# Patient Record
Sex: Female | Born: 1955
Health system: Southern US, Community
[De-identification: ages and names within clinical notes are randomized; demographics above are authoritative.]

## PROBLEM LIST (undated history)

## (undated) DIAGNOSIS — G8929 Other chronic pain: Principal | ICD-10-CM

## (undated) DIAGNOSIS — M549 Dorsalgia, unspecified: Secondary | ICD-10-CM

## (undated) DIAGNOSIS — M2550 Pain in unspecified joint: Secondary | ICD-10-CM

## (undated) DIAGNOSIS — R12 Heartburn: Secondary | ICD-10-CM

## (undated) DIAGNOSIS — R0602 Shortness of breath: Secondary | ICD-10-CM

## (undated) DIAGNOSIS — M7989 Other specified soft tissue disorders: Secondary | ICD-10-CM

## (undated) DIAGNOSIS — I10 Essential (primary) hypertension: Secondary | ICD-10-CM

## (undated) DIAGNOSIS — G2581 Restless legs syndrome: Secondary | ICD-10-CM

## (undated) DIAGNOSIS — M545 Low back pain: Principal | ICD-10-CM

## (undated) DIAGNOSIS — E669 Obesity, unspecified: Secondary | ICD-10-CM

## (undated) DIAGNOSIS — E039 Hypothyroidism, unspecified: Secondary | ICD-10-CM

## (undated) DIAGNOSIS — K5900 Constipation, unspecified: Secondary | ICD-10-CM

## (undated) DIAGNOSIS — F419 Anxiety disorder, unspecified: Secondary | ICD-10-CM

## (undated) DIAGNOSIS — H409 Unspecified glaucoma: Secondary | ICD-10-CM

## (undated) HISTORY — DX: Other specified soft tissue disorders: M79.89

## (undated) HISTORY — PX: TUBAL LIGATION: SHX77

## (undated) HISTORY — DX: Other chronic pain: G89.29

## (undated) HISTORY — DX: Essential (primary) hypertension: I10

## (undated) HISTORY — DX: Low back pain: M54.5

## (undated) HISTORY — DX: Hypothyroidism, unspecified: E03.9

## (undated) HISTORY — DX: Obesity, unspecified: E66.9

## (undated) HISTORY — DX: Heartburn: R12

## (undated) HISTORY — DX: Restless legs syndrome: G25.81

## (undated) HISTORY — PX: FOOT SURGERY: SHX648

## (undated) HISTORY — DX: Dorsalgia, unspecified: M54.9

## (undated) HISTORY — DX: Constipation, unspecified: K59.00

## (undated) HISTORY — DX: Unspecified glaucoma: H40.9

## (undated) HISTORY — DX: Anxiety disorder, unspecified: F41.9

## (undated) HISTORY — DX: Shortness of breath: R06.02

## (undated) HISTORY — PX: TONSILLECTOMY: SUR1361

## (undated) HISTORY — DX: Pain in unspecified joint: M25.50

---

## 1999-06-22 ENCOUNTER — Other Ambulatory Visit: Admission: RE | Admit: 1999-06-22 | Discharge: 1999-06-22 | Payer: Self-pay | Admitting: Family Medicine

## 2000-07-17 ENCOUNTER — Encounter: Admission: RE | Admit: 2000-07-17 | Discharge: 2000-07-17 | Payer: Self-pay | Admitting: Family Medicine

## 2000-07-17 ENCOUNTER — Encounter: Payer: Self-pay | Admitting: Family Medicine

## 2000-07-23 ENCOUNTER — Encounter: Admission: RE | Admit: 2000-07-23 | Discharge: 2000-07-23 | Payer: Self-pay | Admitting: Family Medicine

## 2000-07-23 ENCOUNTER — Encounter: Payer: Self-pay | Admitting: Family Medicine

## 2000-09-13 ENCOUNTER — Ambulatory Visit (HOSPITAL_COMMUNITY): Admission: RE | Admit: 2000-09-13 | Discharge: 2000-09-13 | Payer: Self-pay | Admitting: Neurology

## 2002-06-04 ENCOUNTER — Encounter: Payer: Self-pay | Admitting: Neurology

## 2002-06-04 ENCOUNTER — Encounter: Admission: RE | Admit: 2002-06-04 | Discharge: 2002-06-04 | Payer: Self-pay | Admitting: Neurology

## 2002-07-07 ENCOUNTER — Encounter: Payer: Self-pay | Admitting: Neurology

## 2002-07-07 ENCOUNTER — Encounter: Admission: RE | Admit: 2002-07-07 | Discharge: 2002-07-07 | Payer: Self-pay | Admitting: Neurosurgery

## 2002-07-21 ENCOUNTER — Encounter: Admission: RE | Admit: 2002-07-21 | Discharge: 2002-07-21 | Payer: Self-pay | Admitting: Neurology

## 2002-07-21 ENCOUNTER — Encounter: Payer: Self-pay | Admitting: Radiology

## 2002-07-21 ENCOUNTER — Encounter: Payer: Self-pay | Admitting: Neurology

## 2002-08-08 ENCOUNTER — Encounter: Admission: RE | Admit: 2002-08-08 | Discharge: 2002-08-08 | Payer: Self-pay | Admitting: Neurology

## 2002-08-08 ENCOUNTER — Encounter: Payer: Self-pay | Admitting: Neurology

## 2003-01-20 ENCOUNTER — Encounter: Admission: RE | Admit: 2003-01-20 | Discharge: 2003-01-20 | Payer: Self-pay | Admitting: Neurology

## 2004-04-18 ENCOUNTER — Other Ambulatory Visit: Admission: RE | Admit: 2004-04-18 | Discharge: 2004-04-18 | Payer: Self-pay | Admitting: Obstetrics and Gynecology

## 2005-04-24 ENCOUNTER — Encounter: Admission: RE | Admit: 2005-04-24 | Discharge: 2005-04-24 | Payer: Self-pay | Admitting: Family Medicine

## 2005-05-08 ENCOUNTER — Other Ambulatory Visit: Admission: RE | Admit: 2005-05-08 | Discharge: 2005-05-08 | Payer: Self-pay | Admitting: Family Medicine

## 2005-08-29 ENCOUNTER — Encounter: Admission: RE | Admit: 2005-08-29 | Discharge: 2005-08-29 | Payer: Self-pay | Admitting: Family Medicine

## 2005-10-26 ENCOUNTER — Encounter: Admission: RE | Admit: 2005-10-26 | Discharge: 2005-10-26 | Payer: Self-pay | Admitting: Family Medicine

## 2005-11-07 ENCOUNTER — Other Ambulatory Visit: Admission: RE | Admit: 2005-11-07 | Discharge: 2005-11-07 | Payer: Self-pay | Admitting: Family Medicine

## 2006-05-23 ENCOUNTER — Other Ambulatory Visit: Admission: RE | Admit: 2006-05-23 | Discharge: 2006-05-23 | Payer: Self-pay | Admitting: Family Medicine

## 2008-02-10 ENCOUNTER — Other Ambulatory Visit: Admission: RE | Admit: 2008-02-10 | Discharge: 2008-02-10 | Payer: Self-pay | Admitting: Family Medicine

## 2008-06-02 ENCOUNTER — Encounter: Admission: RE | Admit: 2008-06-02 | Discharge: 2008-06-02 | Payer: Self-pay | Admitting: Family Medicine

## 2008-06-12 ENCOUNTER — Encounter: Admission: RE | Admit: 2008-06-12 | Discharge: 2008-06-12 | Payer: Self-pay | Admitting: Neurology

## 2008-06-16 ENCOUNTER — Encounter: Admission: RE | Admit: 2008-06-16 | Discharge: 2008-06-16 | Payer: Self-pay | Admitting: Family Medicine

## 2008-06-18 ENCOUNTER — Inpatient Hospital Stay (HOSPITAL_COMMUNITY): Admission: EM | Admit: 2008-06-18 | Discharge: 2008-06-21 | Payer: Self-pay | Admitting: Emergency Medicine

## 2009-02-25 ENCOUNTER — Other Ambulatory Visit: Admission: RE | Admit: 2009-02-25 | Discharge: 2009-02-25 | Payer: Self-pay | Admitting: Family Medicine

## 2009-11-02 ENCOUNTER — Emergency Department (HOSPITAL_BASED_OUTPATIENT_CLINIC_OR_DEPARTMENT_OTHER): Admission: EM | Admit: 2009-11-02 | Discharge: 2009-11-02 | Payer: Self-pay | Admitting: Emergency Medicine

## 2010-01-30 HISTORY — PX: VEIN SURGERY: SHX48

## 2010-03-01 ENCOUNTER — Other Ambulatory Visit: Payer: Self-pay | Admitting: Family Medicine

## 2010-03-03 ENCOUNTER — Ambulatory Visit
Admission: RE | Admit: 2010-03-03 | Discharge: 2010-03-03 | Disposition: A | Discharge status: Home / Self Care | Payer: Commercial Managed Care - PPO | Source: Ambulatory Visit | Attending: Family Medicine | Admitting: Family Medicine

## 2010-04-08 ENCOUNTER — Other Ambulatory Visit (HOSPITAL_COMMUNITY)
Admission: RE | Admit: 2010-04-08 | Discharge: 2010-04-08 | Disposition: A | Discharge status: Home / Self Care | Payer: 59 | Source: Ambulatory Visit | Attending: Family Medicine | Admitting: Family Medicine

## 2010-04-08 ENCOUNTER — Other Ambulatory Visit: Payer: Self-pay | Admitting: Family Medicine

## 2010-04-08 DIAGNOSIS — Z124 Encounter for screening for malignant neoplasm of cervix: Secondary | ICD-10-CM | POA: Insufficient documentation

## 2010-05-10 LAB — URINALYSIS, MICROSCOPIC ONLY
Glucose, UA: NEGATIVE mg/dL
Hgb urine dipstick: NEGATIVE
Ketones, ur: 15 mg/dL — AB
Protein, ur: NEGATIVE mg/dL
Specific Gravity, Urine: 1.014 (ref 1.005–1.030)
Urobilinogen, UA: 0.2 mg/dL (ref 0.0–1.0)

## 2010-05-10 LAB — COMPREHENSIVE METABOLIC PANEL
ALT: 27 U/L (ref 0–35)
AST: 19 U/L (ref 0–37)
Albumin: 3.2 g/dL — ABNORMAL LOW (ref 3.5–5.2)
CO2: 23 mEq/L (ref 19–32)
Calcium: 8.6 mg/dL (ref 8.4–10.5)
Calcium: 9.2 mg/dL (ref 8.4–10.5)
Chloride: 100 mEq/L (ref 96–112)
Chloride: 111 mEq/L (ref 96–112)
Creatinine, Ser: 0.75 mg/dL (ref 0.4–1.2)
Creatinine, Ser: 0.88 mg/dL (ref 0.4–1.2)
GFR calc Af Amer: >60 mL/min (ref >60)
GFR calc Af Amer: >60 mL/min (ref >60)
GFR calc non Af Amer: >60 mL/min (ref >60)
Sodium: 141 mEq/L (ref 135–145)
Sodium: 142 mEq/L (ref 135–145)

## 2010-05-10 LAB — CBC
MCHC: 34.3 g/dL (ref 30.0–36.0)
MCHC: 34.5 g/dL (ref 30.0–36.0)
MCHC: 34.8 g/dL (ref 30.0–36.0)
MCHC: 35.3 g/dL (ref 30.0–36.0)
MCV: 89.9 fL (ref 78.0–100.0)
MCV: 90.2 fL (ref 78.0–100.0)
MCV: 90.7 fL (ref 78.0–100.0)
Platelets: 172 10*3/uL (ref 150–400)
Platelets: 181 10*3/uL (ref 150–400)
RBC: 3.5 MIL/uL — ABNORMAL LOW (ref 3.87–5.11)
RBC: 3.68 MIL/uL — ABNORMAL LOW (ref 3.87–5.11)
RBC: 3.68 MIL/uL — ABNORMAL LOW (ref 3.87–5.11)
RDW: 13.5 % (ref 11.5–15.5)
RDW: 14.1 % (ref 11.5–15.5)
WBC: 13.7 10*3/uL — ABNORMAL HIGH (ref 4.0–10.5)

## 2010-05-10 LAB — PROTIME-INR
INR: 1.1 (ref 0.00–1.49)
Prothrombin Time: 14.8 seconds (ref 11.6–15.2)

## 2010-05-10 LAB — BASIC METABOLIC PANEL
BUN: 3 mg/dL — ABNORMAL LOW (ref 6–23)
CO2: 22 mEq/L (ref 19–32)
CO2: 22 mEq/L (ref 19–32)
Calcium: 8.4 mg/dL (ref 8.4–10.5)
Calcium: 8.6 mg/dL (ref 8.4–10.5)
Chloride: 110 mEq/L (ref 96–112)
Creatinine, Ser: 0.72 mg/dL (ref 0.4–1.2)
Creatinine, Ser: 0.72 mg/dL (ref 0.4–1.2)
GFR calc Af Amer: >60 mL/min (ref >60)
GFR calc non Af Amer: >60 mL/min (ref >60)
Glucose, Bld: 97 mg/dL (ref 70–99)
Sodium: 143 mEq/L (ref 135–145)

## 2010-05-10 LAB — DIFFERENTIAL
Eosinophils Absolute: 0.1 10*3/uL (ref 0.0–0.7)
Eosinophils Relative: 0 % (ref 0–5)
Eosinophils Relative: 1 % (ref 0–5)
Lymphocytes Relative: 15 % (ref 12–46)
Lymphs Abs: 2 10*3/uL (ref 0.7–4.0)
Lymphs Abs: 2.5 10*3/uL (ref 0.7–4.0)
Monocytes Absolute: 0.7 10*3/uL (ref 0.1–1.0)
Monocytes Absolute: 0.9 10*3/uL (ref 0.1–1.0)
Monocytes Relative: 5 % (ref 3–12)
Monocytes Relative: 6 % (ref 3–12)
Neutro Abs: 12.6 10*3/uL — ABNORMAL HIGH (ref 1.7–7.7)
Neutrophils Relative %: 78 % — ABNORMAL HIGH (ref 43–77)

## 2010-05-10 LAB — URINE CULTURE: Colony Count: 40000

## 2010-05-10 LAB — APTT: aPTT: 25 seconds (ref 24–37)

## 2010-06-14 NOTE — H&P (Signed)
Alyssa Blair, Alyssa Blair NO.:  [id number]   MEDICAL RECORD NO.:  [id number]          PATIENT TYPE:  EMS   LOCATION:  MAJO                         FACILITY:  [facility]   PHYSICIAN:  Ramiro Harvest, MD    DATE OF BIRTH:  01-Sep-1955   DATE OF ADMISSION:  06/18/2008  DATE OF DISCHARGE:                              HISTORY & PHYSICAL   PRIMARY CARE PHYSICIAN:  Donia Guiles, M.D. of [facility].   HISTORY OF PRESENT ILLNESS:  Alyssa Blair is a 55 year old white female  with history of ischemic colitis, hypertension, hypothyroidism, who  presented to the ED with a 9 day history of constipation.  The patient  states that 7 days prior to admission, developed diffuse constant  abdominal pain described as labor pains and stabbing in nature with  pressure.  The patient has stated that she tried several over-the-  counter regimens including Milk of Magnesia, stool softeners, Senokot,  Dulcolax and magnesium citrate with no improvement.  The patient stated  that she tried a colonoscopy prep the night prior to admission with no  relief.  The patient saw her PCP 2 days prior to admission and abdominal  films done then showed a large amount of feces with no obstruction or  free air seen.  CT of the abdomen and pelvis which were done 2 days  prior to admission showed mild diffuse fatty infiltration of the liver  with no acute abnormality and no significant pelvic abnormality.  The  patient presented to the ED, was seen in the ED, given some Fleet's  enema and Dulcolax with minimal amount of stool.  Acute abdominal series  which was done in the emergency department showed no acute chest  disease, but mild small bowel distention and some air-fluid levels  suspicious for ileus or early small-bowel obstruction, fluid and  contrast material from recent CT scan were noted throughout the colon.  We were called to admit the patient.   ALLERGIES:  CODEINE and SULFA causes hives and  emesis.   PAST MEDICAL HISTORY:  1. History of paresthesias of the hands and the feet.  2. Hypertension.  3. Ischemic colitis.  4. Hypothyroidism.   HOME MEDICATIONS:  1. Benicar/HCTZ 20/12.5 mg p.o. daily.  2. Synthroid 75 mcg p.o. daily.  3. Nortriptyline 100 mg q.h.s.  4. Multivitamin daily.   SOCIAL HISTORY:  No tobacco use.  No alcohol use.  No IV drug use.  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   FAMILY HISTORY:  Noncontributory.   REVIEW OF SYSTEMS:  As per HPI, otherwise negative.   PHYSICAL EXAMINATION:  VITAL SIGNS:  Temperature 98.7, blood pressure  140/80, pulse of 95, respirations 18, satting 95% on room air.  GENERAL:  Patient lying on gurney in minimal distress.  HEENT:  Normocephalic, atraumatic.  Pupils are equal, round and reactive  to light and accommodation.  Extraocular movements intact.  Oropharynx  was clear.  No lesions.  No exudates.  NECK:  Supple.  No lymphadenopathy.  Mildly dry mucous membranes.  RESPIRATORY:  Lungs are  clear to auscultation bilaterally.  No wheezes.  No crackles.  No rhonchi.  CARDIOVASCULAR:  Regular rate and rhythm.  No murmurs, rubs or gallops.  ABDOMEN:  Soft, diffuse abdominal tenderness to palpation lower abdomen,  greater than upper abdomen.  Positive bowel sounds.  Mild distention.  No rebound and no guarding.  EXTREMITIES:  No clubbing, cyanosis or edema.  NEUROLOGIC:  The patient is alert and oriented x3.  Cranial nerves II-  XII are grossly intact.  No focal deficits.   LABORATORY DATA:  CBC:  White count 16.1, hemoglobin 12.6, platelets of  209, hematocrit 36.2, ANC of 12.6.   DIAGNOSTICS:  Acute abdominal series showed no acute disease in the  chest.  Mild small bowel distention and some air-fluid levels suspicious  for ileus or early small-bowel obstruction, fluid and contrast material  from recent CTs throughout the colon.   ASSESSMENT/PLAN:  Alyssa Blair is a 55 year old female  history of  hypothyroidism, ischemic colitis, hypertension, paresthesias of the  hands and feet, who presented to the emergency department with 9-day  history of constipation and found to have a probable ileus versus early  small-bowel obstruction on plain films of the abdomen.  1. Ileus versus early small-bowel obstruction likely secondary to      constipation/obstipation.  We will admit the patient.  Check a      magnesium level.  Check a TSH.  Check a lipase.  Check coags.      Check a comprehensive metabolic profile.  Check UA with cultures      and sensitivities.  Check serial abdominal films.  We will make the      patient n.p.o., IV fluids, pain management, early mobility.  We      will try some tap water enemas.  We will keep potassium greater      than 4.  Keep magnesium greater than 2.  If no improvement with tap      water enemas, may consider disimpaction and a GI consult for      further evaluation and recommendation.  If worsening symptoms,      consider CT of the abdomen and pelvis.  2. Leukocytosis, likely reactive leukocytosis versus infectious      etiology.  Chest x-ray was negative.  Check a UA with cultures and      sensitivities.  No need for antibiotics at this time.  3. Hypothyroidism.  Check a TSH.  Continue home dose Synthroid.  4. Dehydration.  IV fluids.  5. Hypertension, hold blood pressure medications.  6. Ischemic colitis, monitor her.  7. History of paresthesias.  Continue home dose nortriptyline.  8. Prophylaxis:  Protonix for GI prophylaxis.  SCDs for DVT      prophylaxis.   It has been a pleasure taking care of Ms. Alyssa Blair.      Ramiro Harvest, MD  Electronically Signed     DT/MEDQ  D:  06/18/2008  T:  06/18/2008  Job:  [id number]   cc:   Donia Guiles, M.D.

## 2010-06-14 NOTE — Discharge Summary (Signed)
[name], [name]:  [id number]   MEDICAL RECORD NO.:  [id number]          PATIENT TYPE:  INP   LOCATION:  5511                         FACILITY:  [facility]   PHYSICIAN:  Ramiro Harvest, MD    DATE OF BIRTH:  01-02-1956   DATE OF ADMISSION:  06/18/2008  DATE OF DISCHARGE:  06/21/2008                               DISCHARGE SUMMARY   The patient's primary care physician is Dr. Donia Guiles of [facility].   DISCHARGE DIAGNOSES:  1. Ileus resolved.  2. Severe chronic constipation.  3. Hypothyroidism.  4. Dehydration.  5. Reactive leukocytosis resolved.  6. Hypertension.  7. Paresthesias.  8. History of ischemic colitis.  9. Hypothyroidism.   DISCHARGE MEDICATIONS:  1. Amitiza 24 mg p.o. b.i.d.  2. MiraLax 17 grams p.o. daily.  3. Synthroid 100 mcg p.o. daily.  4. Nortriptyline 50 mg p.o. q.h.s.  5. Benicar HCTZ 20/12.5 mg p.o. daily.  6. Multivitamin 1 tablet p.o. daily.   DISPOSITION AND FOLLOW-UP:  The patient will be discharged home.  The  patient is to follow up with Dr. Charlott Rakes of [facility] in the next 2-3 weeks.  The patient is also to follow  up with PCP in the next 1-2 weeks. On follow-up with PCP the patient's  Synthroid was increased to 100 mcg daily secondary to an elevated TSH of  6.235.  The patient will need a repeat TSH done in about 4-6 weeks.  The  patient will also need a basic metabolic profile check to follow up on  electrolytes and renal function.   CONSULTATION:  A GI consult was done.  The patient was seen in  consultation by Dr. Charlott Rakes of [facility] on Jun 19, 2008.   PROCEDURES PERFORMED:  1. An acute abdominal series was performed on Jun 18, 2008 that showed      no acute disease within the chest, mild small bowel distention, and      some air-fluid levels suspicious for ileus or early small-bowel      obstruction. Fluid and contrast material from recent CT scan  noted      throughout the colon.  2. Acute abdominal series Jun 19, 2008 showed probable ileus and      minimal basilar atelectasis.  3. An acute abdominal series was done on Jun 20, 2008 which showed      overall improvement in distention of bowel loops. Currently there      is a nonobstructive bowel gas pattern.   BRIEF ADMISSION HISTORY AND PHYSICAL:  Alyssa Blair is a 55 year old  white female with a history of ischemic colitis, hypertension, and  hypothyroidism, who were presented to the ED with a 9-day history of  constipation.  The patient stated that 7 days prior to admission she  developed diffuse constant abdominal pain described as labor pains and  stabbing in nature and pressure-like. The patient stated that she had  tried several over-the-counter regimens, including milk of magnesia,  stool softener, Senokot, Dulcolax, magnesium citrate, with no  improvement.  The patient also  stated that she tried a colonoscopy prep  the night prior to admission with no relief.  The patient saw her PCP 2  days prior to admission.  Abdominal films were done that showed a large  amount of feces within no obstruction or free air.  CT of the abdomen  and pelvis which was done 2 days prior to admission showed mild diffuse  fatty infiltration of the liver with no acute abnormality and no  significant pelvic abnormality.  The patient presented to the ED, seen  in the ED given a Fleets enema and Dulcolax with minimal amount of  stool. Acute abdominal series which was done in the ED showed no acute  chest disease but mild small bowel distention and some air-fluid levels  suspicious for ileus or early small-bowel obstruction. Fluid and  contrast material from recent CT scan were noted throughout the colon.  We were called to admit the patient.   PHYSICAL EXAM:  VITAL SIGNS:  Temperature 98.7, blood pressure 140/80,  pulse of 95, respirations 18, satting 95% on room air.  GENERAL: The patient  lying on gurney, minimal distress.  HEENT: Normocephalic, atraumatic.  Pupils equal, round and reactive to  light and accommodation.  Extraocular movements intact.  Oropharynx is  clear.  No lesions.  No exudates.  NECK:  Supple.  No lymphadenopathy.  Mildly dry mucous membranes.  RESPIRATORY:  Lungs are clear to auscultation bilaterally.  No wheezes,  no crackles or rhonchi.  CARDIOVASCULAR: Regular rate and rhythm.  No  murmurs, rubs or gallops.  ABDOMEN:  Soft, diffuse abdominal tenderness to palpation in the lower  abdomen greater than the upper abdomen. Positive bowel sounds.  Mild  distention, no rebound or guarding.  EXTREMITIES: No clubbing, cyanosis or edema.  NEUROLOGICAL:  The patient is alert and oriented x3.  Cranial nerves II-  XII are grossly intact.  No focal deficits.   ADMISSION LABS:  CBC white count 16.1, hemoglobin 12.6, platelets of  209, hematocrit 36.2, ANC of 12.6.  Acute abdominal series as stated  above.   HOSPITAL COURSE:  Ileus/severe chronic constipation.  The patient was  admitted with ileus, severe chronic constipation.  The patient was  monitored.  She was made n.p.o., placed on a bowel rest with supportive  care with IV fluids and pain management.  A TSH level was checked which  came back at 6.235.  The patient's Synthroid was then increased to 100  mcg daily.  The patient was monitored. Potassium was tried to be kept  greater than 4 and the magnesium kept greater than 2.  Serial abdominal  films were obtained with the results as stated above.  The patient  improved.  Post hospital day #1 was placed on a clear liquid diet and GI  was consulted.  The patient was seen in consultation on Jun 19, 2008 by  Dr. Charlott Rakes. The patient was continued on further Fleets enemas  and started on MiraLax twice daily.  The patient did have multiple bowel  movements with clinical improvement.  The patient's diet was advanced.  The patient improved clinically  such that by day of discharge the  patient was in stable and improved condition.  It was recommended by  gastroenterology that the patient be maintained on Amitiza 24 mg b.i.d.  as well as MiraLax 17 grams orally daily with follow-up with Dr. Charlott Rakes of gastroenterology in 2-3 weeks. The rest of the patient's  chronic medical issues remained stable during  the hospitalization. In  terms of the patient's hypothyroidism a TSH was checked which came back  elevated at 6.235 and as such the patient's Synthroid was increased from  75 mcg to 100 mcg daily.  The patient will need follow-up with her PCP  in terms of hypothyroidism. Will need a repeat TSH done in about 4-6  weeks and further titration of her Synthroid will be deferred to her  PCP. The rest of the patient's chronic medical issues remained stable  throughout the hospitalization and the patient will be discharged in  stable and improved condition.   On day of discharge vital signs temperature 98.1, blood pressure 123/65,  pulse of 75, respirations 20, satting 92% on room air.   DISCHARGE LABS:  Sodium 143, potassium 3.7, chloride 115, bicarb 22, BUN  3, creatinine 0.72, glucose of 86, calcium of 8.4.  CBC with a white  count of 9.3, hemoglobin of 10.9, platelets of 185, hematocrit of 31.7.  it has been a pleasure taking care of Alyssa Blair.      Ramiro Harvest, MD  Electronically Signed     DT/MEDQ  D:  06/21/2008  T:  06/21/2008  Job:  [id number]   cc:   Donia Guiles, M.D.  Shirley Friar, MD

## 2010-06-14 NOTE — Consult Note (Signed)
NAMEASHE, GRAYBEAL                 ACCOUNT NO.:  [id number]   MEDICAL RECORD NO.:  [id number]          PATIENT TYPE:  INP   LOCATION:  5511                         FACILITY:  [facility]   PHYSICIAN:  Shirley Friar, MDDATE OF BIRTH:  Feb 03, 1955   DATE OF CONSULTATION:  06/19/2008  DATE OF DISCHARGE:                                 CONSULTATION   REFERRING PHYSICIAN:  Lonia Blood, MD   We were called to see Ms. Banta today by Dr. Lonia Blood, MD, of [facility].   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  She reports that she had no bowel movements for the past 9-10  days.  She developed severe stabbing abdominal pain.  She has not had  any vomiting.  At home, the patient regularly consumes 4 Colace at  bedtime, however, when she was not producing stools, she also started to  consume magnesium citrate and Dulcolax.  When these were ineffective,  she went to see her primary care physician, Dr. Donia Guiles, who  called [facility], and the patient picked up a MoviPrep and  she consume that without results.  This was all prior to admission.  The  patient also tells me that she had a spinal injection last week to help  with her paresthesias that she has in her extremities.  She does not  take any opiates.  Her last colonoscopy was in 2006 at [facility], it was done by Dr. Marcelene Butte; however, she does not  regularly follow with him and wants to come to the [facility] as she is associated with Dr. Donia Guiles.   She does have a past medical history concerning for ischemic colitis and  multiple episodes of diverticulitis.  She has a history of hypertension,  hypothyroidism, and as mentioned earlier, paresthesias in her hands and  feet.   CURRENT MEDICATIONS:  Benicar, nortriptyline, and Levoxyl.  She is also  on  regular milk of magnesia and Colace.   ALLERGIES:  She has allergies to CODEINE and SULFA.   SOCIAL HISTORY:  Negative for alcohol, tobacco, and drugs.  She works at  [employer].   FAMILY HISTORY:  Positive for constipation.  Negative for colon cancer  or bowel surgery.   PHYSICAL EXAMINATION:  GENERAL:  She is alert, oriented, in no apparent  distress.  She does become tearful when talking about her condition  because she is frustrated.  VITAL SIGNS:  Temperature 98.1, pulse 81, respirations 18, and blood  pressure is 119/59.  HEART:  Regular rate and rhythm.  LUNGS:  Clear.  ABDOMEN:  Obese, slightly tender in the right lower quadrant.  She has  good bowel sounds.   LABORATORIES:  Hemoglobin of 11.5, hematocrit 33.4, white count is 13.7  that is down from 16.1 yesterday, and platelets are 181,000.  She has a  TSH of 6.235.  Her coags are normal.  Her LFTs are within normal limits.  Her BMET is within normal limits.   RADIOLOGICAL EXAMS:  She had a x-ray done on May 18 that showed stool  throughout her colon.  She had a CT scan done the same day that showed  diffuse mild fatty liver, degenerative changes of the spine, but no  acute abnormality.  She had an x-ray done May 20 that showed early SBO.  She had x-ray done today May 21 that showed probable ileus with air in  the small bowel and dilation in the colon.   ASSESSMENT:  Dr. Charlott Rakes has seen and examined the patient,  collected history, and reviewed her chart.  His impression is that she  has severe chronic constipation.  She did get some relief after one  small bowel movement last night.  She now has ileus on x-ray.  She is  hypothyroid.  She appears to have a chronically slow moving GI tract.   PLAN:  Synthroid is being given.  Potassium supplementation has been  given and is appropriate.  Would continue tap water enemas and hold off  on oral laxatives today, but continues enemas and disimpaction attempts   as well as get the patient ambulating in the hallways.  Would consider  colonoscopy when the patient is cleaned out.  We will gain records from  [facility] of previous colonoscopy.   Thank you very much for this consultation.       Stephani Police, [place]      Shirley Friar, MD  Electronically Signed    MLY/MEDQ  D:  06/19/2008  T:  06/20/2008  Job:  [id number]   cc:   Shirley Friar, MD  Donia Guiles, M.D.

## 2010-06-17 NOTE — Op Note (Signed)
[place]  Patient:    Alyssa Blair, Alyssa Blair                          MRN: [id number] Proc. Date: 09/13/00 Adm. Date:  [id number] Attending:  Lesly Dukes CC:         [facility],  [address]   Operative Report  PROCEDURE:  Lumbar puncture note.  HISTORY OF PRESENT ILLNESS:  [name] is a 55 year old white female, born 10/24/1955, with a history of paresthesias on all fours.  An MRI scan of the brain showed minimal white matter abnormalities, and the patient is being evaluated for a demyelinating disease at this point.  A lumbar puncture was performed with the patient in the fetal position on the left side.  Risks of the procedure were explained to the patient including bleeding, infection, nerve damage, and headache.  The patient understood the procedure and consented to the procedure.  DESCRIPTION OF PROCEDURE:  The lower back was cleaned with Betadine solution, and approximately 2 cc of 1% Xylocaine was used as local anesthetic.  A 20-gauge spinal needle was inserted into the L3-L4 interspace, and approximately 16 cc of clear, colorless spinal fluid was removed for testing. Opening pressure was 145 mm of water.  Tube #1 was sent for VDRL, cryptococcal antigen.  Tube #2 was sent for oligoclonal banding, IgG-albumin ratio.  Tube #3 was sent for cells, differential, glucose, and protein.  Tube #4 was sent for Lymes panel.  There were no complications to the above procedure.  The patient tolerated the procedure well.  Blood work was also sent for ANA, rheumatoid factor, sedimentation rate, antiphospholipid antibody panel, homocystine level, antithrombin-III, and protein-S, protein-C levels.  The patient will follow up with the office in about seven days. DD:  09/13/00 TD:  09/13/00 Job: [id number]

## 2010-10-10 IMAGING — CR DG FOOT COMPLETE 3+V*L*
3 series · 3 of 3 positions shown · non-contrast
Comparison: None

CLINICAL DATA: Left foot pain and swelling.

LEFT FOOT - COMPLETE 3+ VIEW

[t foot ap left]
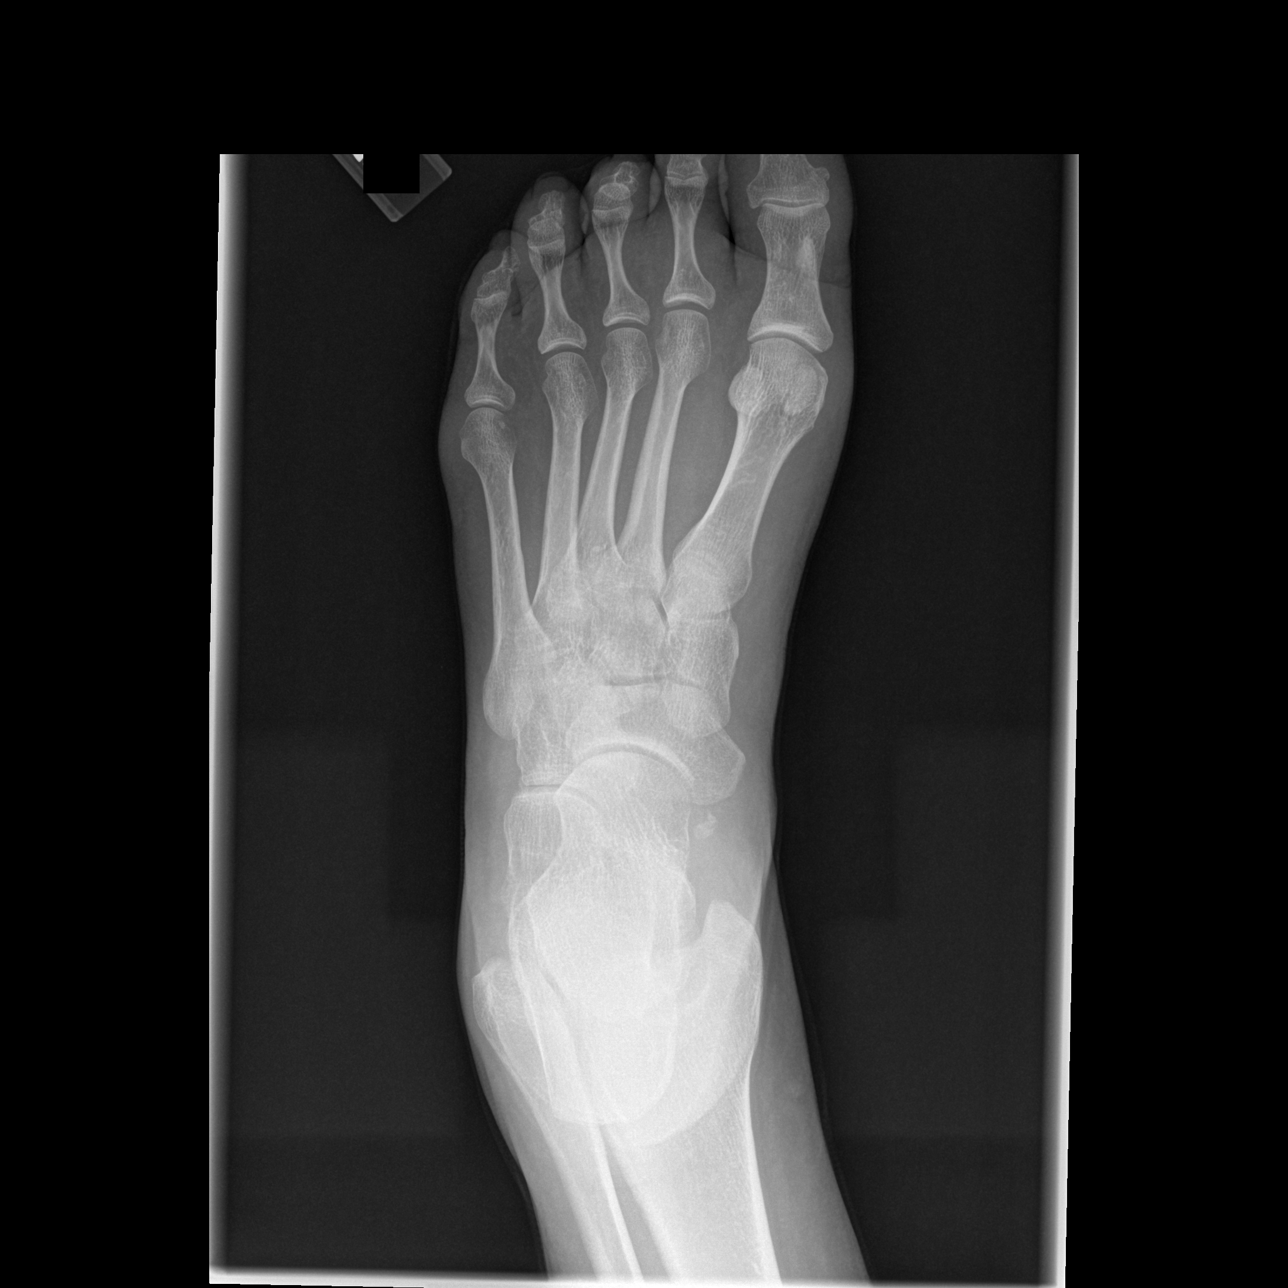

[t foot oblique left]
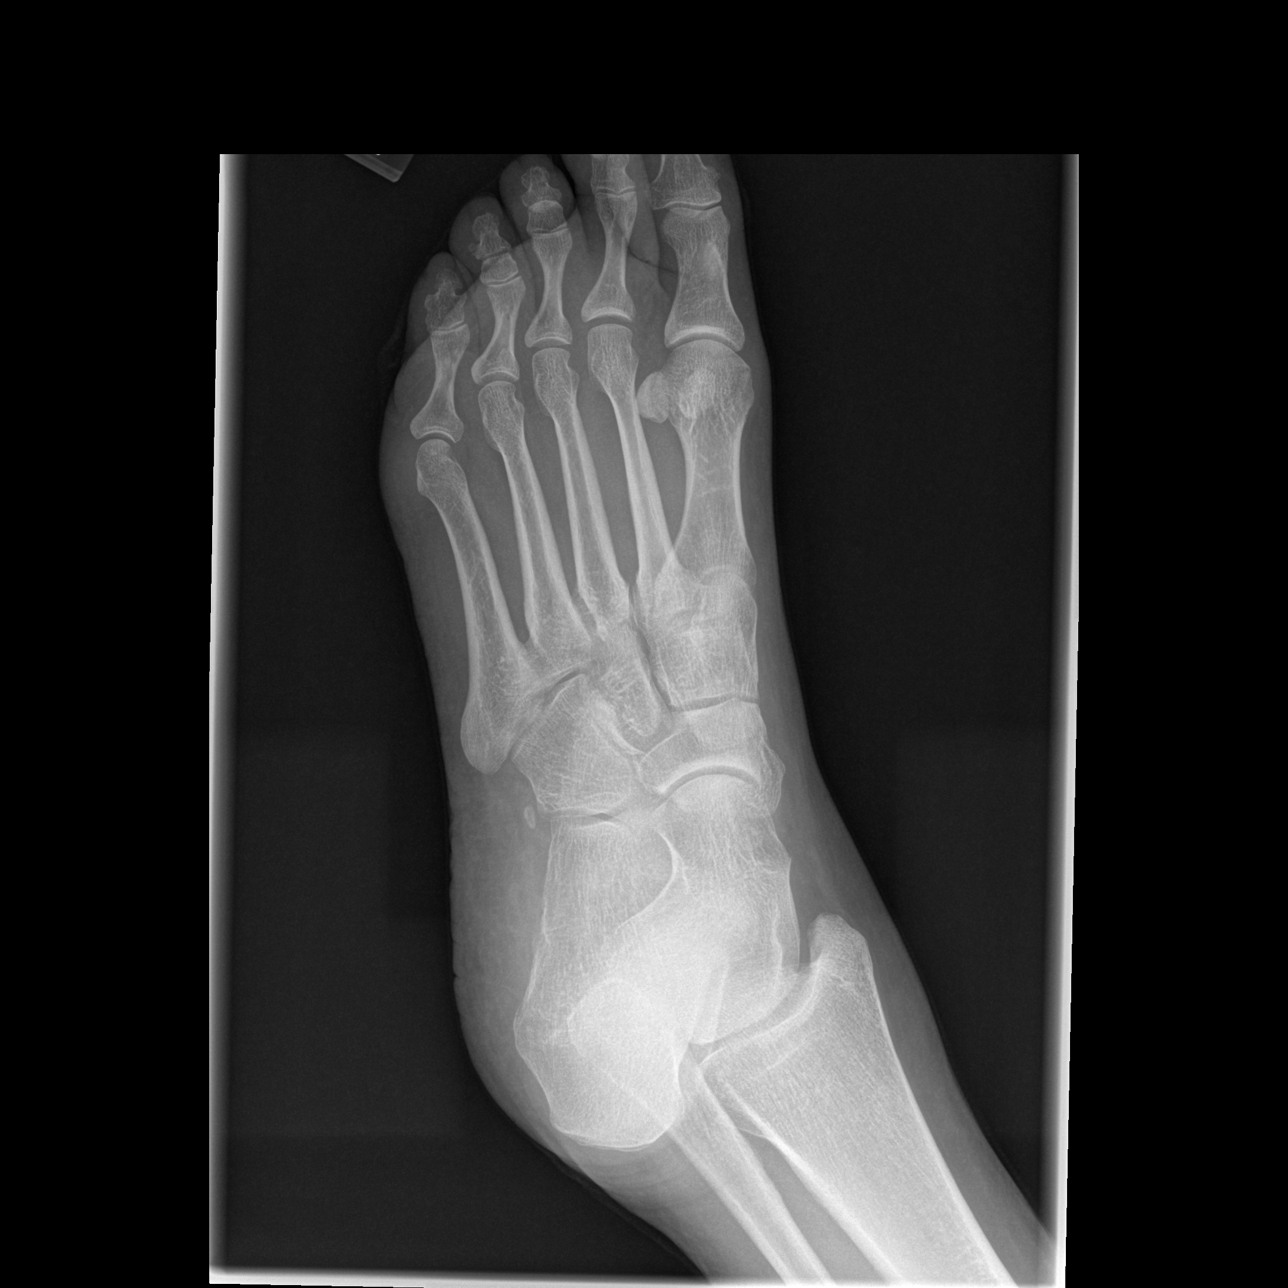

[t foot lat left]
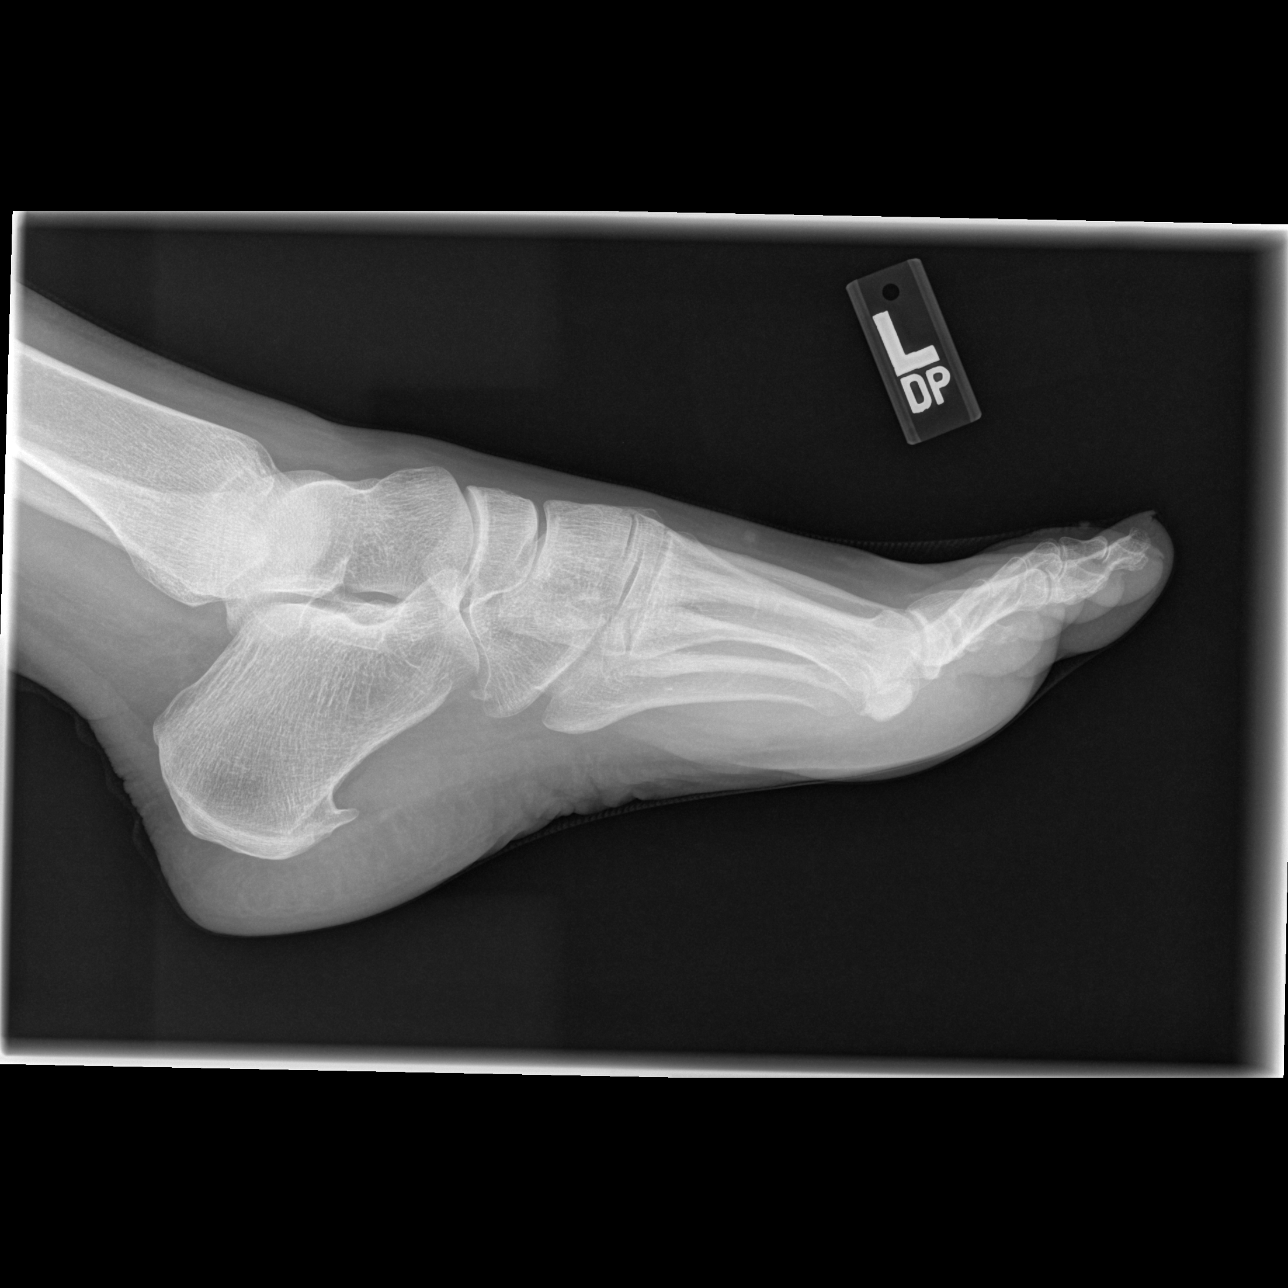

[3 of 3 positions shown; findings below may reference images not displayed]

FINDINGS: There is no evidence of acute fracture, subluxation, or
dislocation.
The Lisfranc joints are intact.
No focal bony lesions are identified.
There is no evidence of radiopaque foreign body.
A small calcaneal spur is noted.
IMPRESSION: No evidence of acute abnormality.

Calcaneal spur.

## 2012-01-31 HISTORY — PX: CHOLECYSTECTOMY: SHX55

## 2012-05-29 ENCOUNTER — Other Ambulatory Visit: Payer: Self-pay | Admitting: Neurological Surgery

## 2012-05-29 DIAGNOSIS — M542 Cervicalgia: Secondary | ICD-10-CM

## 2012-06-06 ENCOUNTER — Ambulatory Visit
Admission: RE | Admit: 2012-06-06 | Discharge: 2012-06-06 | Disposition: A | Discharge status: Home / Self Care | Payer: 59 | Source: Ambulatory Visit | Attending: Neurological Surgery | Admitting: Neurological Surgery

## 2012-06-06 VITALS — BP 140/58 | HR 71

## 2012-06-06 DIAGNOSIS — M542 Cervicalgia: Secondary | ICD-10-CM

## 2012-06-06 MED ORDER — TRIAMCINOLONE ACETONIDE 40 MG/ML IJ SUSP (RADIOLOGY)
60.0000 mg | Freq: Once | INTRAMUSCULAR | Status: AC
  Administered 2012-06-06: 60 mg via EPIDURAL

## 2012-06-06 MED ORDER — IOHEXOL 300 MG/ML  SOLN
1.0000 mL | Freq: Once | INTRAMUSCULAR | Status: AC | PRN
  Administered 2012-06-06: 1 mL via EPIDURAL

## 2012-09-24 ENCOUNTER — Ambulatory Visit
Admission: RE | Admit: 2012-09-24 | Discharge: 2012-09-24 | Disposition: A | Discharge status: Home / Self Care | Payer: 59 | Source: Ambulatory Visit | Attending: Family Medicine | Admitting: Family Medicine

## 2012-09-24 ENCOUNTER — Other Ambulatory Visit: Payer: Self-pay | Admitting: Family Medicine

## 2012-09-24 DIAGNOSIS — R609 Edema, unspecified: Secondary | ICD-10-CM

## 2013-01-30 HISTORY — PX: ROTATOR CUFF REPAIR: SHX139

## 2013-06-24 ENCOUNTER — Other Ambulatory Visit: Payer: Self-pay | Admitting: Family Medicine

## 2013-06-24 ENCOUNTER — Ambulatory Visit
Admission: RE | Admit: 2013-06-24 | Discharge: 2013-06-24 | Disposition: A | Discharge status: Home / Self Care | Payer: 59 | Source: Ambulatory Visit | Attending: Family Medicine | Admitting: Family Medicine

## 2013-06-24 DIAGNOSIS — M5431 Sciatica, right side: Secondary | ICD-10-CM

## 2013-08-08 ENCOUNTER — Other Ambulatory Visit (HOSPITAL_COMMUNITY)
Admission: RE | Admit: 2013-08-08 | Discharge: 2013-08-08 | Disposition: A | Discharge status: Home / Self Care | Payer: 59 | Source: Ambulatory Visit | Attending: Family Medicine | Admitting: Family Medicine

## 2013-08-08 ENCOUNTER — Other Ambulatory Visit: Payer: Self-pay | Admitting: Family Medicine

## 2013-08-08 DIAGNOSIS — Z1151 Encounter for screening for human papillomavirus (HPV): Secondary | ICD-10-CM | POA: Insufficient documentation

## 2013-08-08 DIAGNOSIS — Z124 Encounter for screening for malignant neoplasm of cervix: Secondary | ICD-10-CM | POA: Insufficient documentation

## 2013-08-13 LAB — CYTOLOGY - PAP

## 2013-08-14 ENCOUNTER — Telehealth: Payer: Self-pay

## 2013-08-14 NOTE — Telephone Encounter (Signed)
Called pt per Dr. Conley RollsLe to give results of xray of her knee. Pt answered and understood.

## 2013-10-20 ENCOUNTER — Other Ambulatory Visit: Payer: Self-pay | Admitting: Gastroenterology

## 2014-05-25 ENCOUNTER — Telehealth: Payer: Self-pay | Admitting: Neurology

## 2014-05-25 NOTE — Telephone Encounter (Signed)
Patient called wanting to know if Dr. Anne HahnWillis can fill out [facility]. Patient would like to know if she can drop this form off for Dr. Anne HahnWillis to fill out or does she need to make an appointment.  Please call and advice. c/b  [phone]

## 2014-05-25 NOTE — Telephone Encounter (Signed)
Appointment made 06/02/14.

## 2014-05-25 NOTE — Telephone Encounter (Signed)
I called the patient. It has been 6 years since we have seen her, last seen in April 2010. We will need a new patient visit to fill out the form again. The last form was filled out in 2011.

## 2014-06-02 ENCOUNTER — Encounter: Payer: Self-pay | Admitting: Neurology

## 2014-06-02 ENCOUNTER — Ambulatory Visit (INDEPENDENT_AMBULATORY_CARE_PROVIDER_SITE_OTHER): Payer: 59 | Admitting: Neurology

## 2014-06-02 VITALS — BP 144/75 | HR 65 | Ht 66.0 in | Wt 207.8 lb

## 2014-06-02 DIAGNOSIS — M545 Low back pain, unspecified: Secondary | ICD-10-CM

## 2014-06-02 DIAGNOSIS — G8929 Other chronic pain: Secondary | ICD-10-CM | POA: Insufficient documentation

## 2014-06-02 HISTORY — DX: Other chronic pain: G89.29

## 2014-06-02 HISTORY — DX: Low back pain, unspecified: M54.50

## 2014-06-02 MED ORDER — PREGABALIN 25 MG PO CAPS: ORAL_CAPSULE | ORAL | Status: DC

## 2014-06-02 NOTE — Progress Notes (Signed)
Reason for visit: Low back pain, right leg pain   Alyssa Blair is a 59 y.o. female  History of present illness:  Ms. Rusk is a 59 year old right-handed white female with a history of low back pain dating back 6 or 7 years. The patient was last seen through this office in 2010. The patient is felt to have a low-grade L5 radiculopathy at that time. The patient is continuing to have low back pain that has worsened somewhat since January 2016. The pain radiates from the back down the right leg to the foot area and the patient has numbness and tingling sensations in this distribution as well. The patient denies any issues with the left leg. The patient has been taking trazodone for sleep, and she takes Ultram if needed for pain. In the past, she has been on Lyrica with some benefit. She indicates that epidural steroid injections have also been helpful. The patient denies any weakness in the legs, she has not had any falls, and she denies issues controlling the bowels or the bladder. She will note a hot sensation from the lower chest level down on the abdomen and legs at nighttime. This seems to get better when she is up and active. The patient returns for an evaluation.  Past Medical History  Diagnosis Date  . Hypertension   . Chronic low back pain 06/02/2014  . Obesity   . Hypothyroid   . Restless leg syndrome     Past Surgical History  Procedure Laterality Date  . Cholecystectomy  2014  . Rotator cuff repair  2015  . Vein surgery  2012  . Tubal ligation Bilateral   . Foot surgery    . Tonsillectomy      Family History  Problem Relation Age of Onset  . Hypertension Mother   . Heart disease Father   . Hypertension Sister   . Hypertension Brother   . Hypertension Sister   . Hypertension Brother   . Hypertension Brother     Social history:  reports that she quit smoking about 11 years ago. Her smoking use included Cigarettes. She has a 9.6 pack-year smoking history. She has  never used smokeless tobacco. She reports that she does not drink alcohol or use illicit drugs.  Medications:  Prior to Admission medications   Medication Sig Start Date End Date Taking? Authorizing Provider  irbesartan-hydrochlorothiazide (AVALIDE) 150-12.5 MG per tablet Take 1 tablet by mouth daily.   Yes Historical Provider, MD  levothyroxine (SYNTHROID, LEVOTHROID) 100 MCG tablet Take 100 mcg by mouth daily before breakfast.   Yes Historical Provider, MD  traMADol (ULTRAM) 50 MG tablet Take 50 mg by mouth every 6 (six) hours as needed.   Yes Historical Provider, MD  traZODone (DESYREL) 50 MG tablet Take 50 mg by mouth at bedtime.   Yes Historical Provider, MD  pregabalin (LYRICA) 25 MG capsule One capsule twice a day for 2 weeks, then take 2 capsules twice a day 06/02/14   York Spaniel, MD      Allergies  Allergen Reactions  . Codeine   . Sulfa Antibiotics     ROS:  Out of a complete 14 system review of symptoms, the patient complains only of the following symptoms, and all other reviewed systems are negative.  Swelling in the legs Feeling hot Joint pain Numbness Not enough sleep  Blood pressure 144/75, pulse 65, height  (1.676 m), weight 207 lb 12.8 oz (94.257 kg).  Physical Exam  General: The patient is alert and cooperative at the time of the examination. The patient is moderately obese.  Eyes: Pupils are equal, round, and reactive to light. Discs are flat bilaterally.  Neck: The neck is supple, no carotid bruits are noted.  Respiratory: The respiratory examination is clear.  Cardiovascular: The cardiovascular examination reveals a regular rate and rhythm, no obvious murmurs or rubs are noted.  Neuromuscular: Range of movement of the lumbar spine was full.  Skin: Extremities are without significant edema.  Neurologic Exam  Mental status: The patient is alert and oriented x 3 at the time of the examination. The patient has apparent normal recent and  remote memory, with an apparently normal attention span and concentration ability.  Cranial nerves: Facial symmetry is present. There is good sensation of the face to pinprick and soft touch bilaterally. The strength of the facial muscles and the muscles to head turning and shoulder shrug are normal bilaterally. Speech is well enunciated, no aphasia or dysarthria is noted. Extraocular movements are full. Visual fields are full. The tongue is midline, and the patient has symmetric elevation of the soft palate. No obvious hearing deficits are noted.  Motor: The motor testing reveals 5 over 5 strength of all 4 extremities. Good symmetric motor tone is noted throughout.  Sensory: Sensory testing is intact to pinprick, soft touch, vibration sensation, and position sense on all 4 extremities, with exception of some decrease in pinprick sensation on the lateral aspect of the right leg and on the dorsum of the foot. Position sense is decreased on the right foot relative to the left.. No evidence of extinction is noted.  Coordination: Cerebellar testing reveals good finger-nose-finger and heel-to-shin bilaterally.  Gait and station: Gait is normal. Tandem gait is normal. Romberg is negative. No drift is seen. The patient is able to walk on heels and the toes bilaterally.  Reflexes: Deep tendon reflexes are symmetric and normal bilaterally. Toes are downgoing bilaterally.   Assessment/Plan:  1. Chronic low back pain, right leg pain  2. Obesity  The patient continues to have ongoing sciatica type pain down the right leg. The patient has some sensory alterations in the L5 distribution. The patient will be placed on Lyrica taking 25 mg twice daily for 2 weeks, then take 50 mg twice daily. The patient may be sent for epidural steroid injections in the future if needed. She will follow-up in about 6 months. The patient has been seen by Dr. Marikay Alaravid Jones previously.  Marlan Palau. Keith Napoleon Monacelli MD 06/02/2014 9:41  PM  [facility] [address] [place], [place] [address]  Phone [phone] Fax [phone]

## 2014-06-02 NOTE — Patient Instructions (Signed)
Back Pain, Adult Low back pain is very common. About 1 in 5 people have back pain.The cause of low back pain is rarely dangerous. The pain often gets better over time.About half of people with a sudden onset of back pain feel better in just 2 weeks. About 8 in 10 people feel better by 6 weeks.  CAUSES Some common causes of back pain include:  Strain of the muscles or ligaments supporting the spine.  Wear and tear (degeneration) of the spinal discs.  Arthritis.  Direct injury to the back. DIAGNOSIS Most of the time, the direct cause of low back pain is not known.However, back pain can be treated effectively even when the exact cause of the pain is unknown.Answering your caregiver's questions about your overall health and symptoms is one of the most accurate ways to make sure the cause of your pain is not dangerous. If your caregiver needs more information, he or she may order lab work or imaging tests (X-rays or MRIs).However, even if imaging tests show changes in your back, this usually does not require surgery. HOME CARE INSTRUCTIONS For many people, back pain returns.Since low back pain is rarely dangerous, it is often a condition that people can learn to manageon their own.   Remain active. It is stressful on the back to sit or stand in one place. Do not sit, drive, or stand in one place for more than 30 minutes at a time. Take short walks on level surfaces as soon as pain allows.Try to increase the length of time you walk each day.  Do not stay in bed.Resting more than 1 or 2 days can delay your recovery.  Do not avoid exercise or work.Your body is made to move.It is not dangerous to be active, even though your back may hurt.Your back will likely heal faster if you return to being active before your pain is gone.  Pay attention to your body when you bend and lift. Many people have less discomfortwhen lifting if they bend their knees, keep the load close to their bodies,and  avoid twisting. Often, the most comfortable positions are those that put less stress on your recovering back.  Find a comfortable position to sleep. Use a firm mattress and lie on your side with your knees slightly bent. If you lie on your back, put a pillow under your knees.  Only take over-the-counter or prescription medicines as directed by your caregiver. Over-the-counter medicines to reduce pain and inflammation are often the most helpful.Your caregiver may prescribe muscle relaxant drugs.These medicines help dull your pain so you can more quickly return to your normal activities and healthy exercise.  Put ice on the injured area.  Put ice in a plastic bag.  Place a towel between your skin and the bag.  Leave the ice on for 15-20 minutes, 03-04 times a day for the first 2 to 3 days. After that, ice and heat may be alternated to reduce pain and spasms.  Ask your caregiver about trying back exercises and gentle massage. This may be of some benefit.  Avoid feeling anxious or stressed.Stress increases muscle tension and can worsen back pain.It is important to recognize when you are anxious or stressed and learn ways to manage it.Exercise is a great option. SEEK MEDICAL CARE IF:  You have pain that is not relieved with rest or medicine.  You have pain that does not improve in 1 week.  You have new symptoms.  You are generally not feeling well. SEEK   IMMEDIATE MEDICAL CARE IF:   You have pain that radiates from your back into your legs.  You develop new bowel or bladder control problems.  You have unusual weakness or numbness in your arms or legs.  You develop nausea or vomiting.  You develop abdominal pain.  You feel faint. Document Released: 01/16/2005 Document Revised: 07/18/2011 Document Reviewed: 05/20/2013 ExitCare Patient Information 2015 [employer]. This information is not intended to replace advice given to you by your health care provider. Make sure you  discuss any questions you have with your health care provider.  

## 2014-08-19 ENCOUNTER — Ambulatory Visit
Admission: RE | Admit: 2014-08-19 | Discharge: 2014-08-19 | Disposition: A | Discharge status: Home / Self Care | Payer: 59 | Source: Ambulatory Visit | Attending: Family Medicine | Admitting: Family Medicine

## 2014-08-19 ENCOUNTER — Other Ambulatory Visit: Payer: Self-pay | Admitting: Family Medicine

## 2014-08-19 DIAGNOSIS — G4489 Other headache syndrome: Secondary | ICD-10-CM

## 2014-12-03 ENCOUNTER — Ambulatory Visit: Payer: 59 | Admitting: Neurology

## 2014-12-07 ENCOUNTER — Encounter: Payer: Self-pay | Admitting: Sports Medicine

## 2014-12-07 ENCOUNTER — Ambulatory Visit
Admission: RE | Admit: 2014-12-07 | Discharge: 2014-12-07 | Disposition: A | Discharge status: Home / Self Care | Payer: 59 | Source: Ambulatory Visit | Attending: Sports Medicine | Admitting: Sports Medicine

## 2014-12-07 ENCOUNTER — Ambulatory Visit (INDEPENDENT_AMBULATORY_CARE_PROVIDER_SITE_OTHER): Payer: 59 | Admitting: Sports Medicine

## 2014-12-07 VITALS — BP 129/46 | HR 65 | Ht 66.0 in | Wt 213.0 lb

## 2014-12-07 DIAGNOSIS — M25551 Pain in right hip: Secondary | ICD-10-CM

## 2014-12-07 NOTE — Progress Notes (Addendum)
   Subjective:    Patient ID: Alyssa RodriguezLisa D Dust, female    DOB: 10/31/1955, 59 y.o.   MRN: [id number]  HPI chief complaint: Right hip pain  Very pleasant 59 year old female comes in today at the request of Dr.Shaw for evaluation of her right hip. She has had ongoing right hip pain since the 1990s. She originally saw a neurologist and had a complete workup which was unremarkable per her report. She complains of a burning type pain in the anterior lateral hip which is most noticeable when getting up and down or when sleeping at night. She has associated numbness and tingling down the entire right leg as well as area. She gets intermittent weakness in the right leg. She denies deep-seated groin pain. She has very little pain with ambulation. She was given a prescription for meloxicam which helps quite a bit. She has also been given a cortisone injection in her area of maximum tenderness in her hip which did result in symptom resolution for a few weeks. She denies any low back pain. No prior hip or low back surgeries. No recent trauma. No recent x-rays. She has tried both gabapentin and Lyrica in the past without any improvement.  Past medical history reviewed Surgical history reviewed. She is status post left shoulder rotator cuff repair done by Dr. Thurston HoleWainer. Medications reviewed Allergies reviewed    Review of Systems As above    Objective:   Physical Exam Well-developed, overweight. No acute distress. Awake alert and oriented 3.  Lumbar spine: Full painless lumbar range of motion. No tenderness to palpation along the lumbar midline or paraspinal musculature. No tenderness over the SI joint.  Right hip: Smooth painless hip range of motion with a negative log roll. There is some palpable tenderness diffusely along the lateral hip as well as along the anterior aspect of the iliac crest.  Neurological exam: Positive straight leg raise on the right. Negative on the left. 4/5 strength with resisted  plantar flexion on the right compared to 5/5 on the left. 4/5 strength with resisted great toe extension on the right compared to 5/5 on the left. No atrophy. Patellar tendon reflexes are 3/4 bilaterally. Achilles reflex on the left is 2/4 and absent on the right. No atrophy. Good dorsalis pedis and posterior tibial pulses.  Ambulating without a limp.       Assessment & Plan:  Chronic hip pain-rule out lumbar radiculopathy  I'm going to start with getting x-rays of both her lumbar spine and her right hip. I will also get an EMG/nerve conduction study to better evaluate her chronic right leg lumbar radiculopathy. Patient may have some concomitant greater trochanteric bursitis. We will call the patient with her x-ray results once available and she will follow-up with me in the office after her EMG/nerve conduction study. In the meantime I think she is safe to continue with her meloxicam as needed. Safety continue with activity as tolerated as well.  AddendumHaynes Bast: [facility] from May of this year was reviewed. According to that note patient has had good success with lumbar epidural steroid injections in the past. She apparently has also seen Dr. Marikay Alaravid Jones in the past. I'll discuss this with her in greater detail at follow-up.

## 2014-12-08 ENCOUNTER — Ambulatory Visit: Payer: Self-pay | Admitting: Neurology

## 2014-12-09 ENCOUNTER — Other Ambulatory Visit: Payer: Self-pay | Admitting: *Deleted

## 2014-12-09 DIAGNOSIS — M25551 Pain in right hip: Secondary | ICD-10-CM

## 2014-12-10 ENCOUNTER — Telehealth: Payer: Self-pay | Admitting: Sports Medicine

## 2014-12-10 NOTE — Telephone Encounter (Signed)
I spoke with the patient on the phone today after reviewing x-rays of her lumbar spine and right hip. She has moderately advanced disc space narrowing at L5-S1. Hip x-rays are fairly unremarkable. Based on her lumbar spine x-rays and her clinical presentation I suspect that her pain generator is likely from the L5-S1 degenerative disc disease. She has her nerve conduction study scheduled for next week. I will follow-up with her with those results once available at which point we will delineate further workup and treatment.

## 2014-12-15 ENCOUNTER — Ambulatory Visit (INDEPENDENT_AMBULATORY_CARE_PROVIDER_SITE_OTHER): Payer: 59 | Admitting: Neurology

## 2014-12-15 DIAGNOSIS — M25551 Pain in right hip: Secondary | ICD-10-CM

## 2014-12-15 DIAGNOSIS — M5417 Radiculopathy, lumbosacral region: Secondary | ICD-10-CM

## 2014-12-15 NOTE — Procedures (Signed)
[facility]  [address], [address]  [place], [place] [address] Tel: [phone]) [phone] Fax:  [phone] Test Date:  12/15/2014  Patient: Alyssa GunningLisa Blair DOB: 06/06/1955 Physician: Nita Sickleonika Jerusalen Mateja, DO  Sex: Female Height: 5\' 6"  Ref Phys: Dr Margaretha Sheffieldraper  ID#: [id number] Temp: 35.5C Technician: Judie PetitM. Dean   Patient Complaints: This is a 59 year old female presenting for evaluation of right hip pain and paresthesias radiating into her leg.  NCV & EMG Findings: Extensive electrodiagnostic testing of the right lower extremity and additional studies of the left shows: 1. Right sural and superficial peroneal sensory responses are within normal limits. 2. Right peroneal and tibial motor responses are within normal limits. 3. Right tibial H reflex studies within normal limits. 4. Chronic motor axon loss changes are seen affecting the gastrocnemius and biceps femoris short head muscles on the right. There is no evidence of accompanied active denervation.  Impression: 1. Chronic S1 radiculopathy affecting the right lower extremity; mild in degree electrically. 2. There is no evidence of a generalized sensorimotor polyneuropathy affecting the right lower extremity.   ___________________________ Nita Sickleonika Renada Cronin, DO    Nerve Conduction Studies Anti Sensory Summary Table   Site NR Peak (ms) Norm Peak (ms) P-T Amp (V) Norm P-T Amp  Right Sup Peroneal Anti Sensory (Ant Lat Mall)  35.5C  12 cm    2.4 <4.6 9.8 >3  Right Sural Anti Sensory (Lat Mall)  35.5C  Calf    2.8 <4.6 10.5 >3   Motor Summary Table   Site NR Onset (ms) Norm Onset (ms) O-P Amp (mV) Norm O-P Amp Site1 Site2 Delta-0 (ms) Dist (cm) Vel (m/s) Norm Vel (m/s)  Right Peroneal Motor (Ext Dig Brev)  35.5C  Ankle    3.8 <6.0 5.6 >2.5 B Fib Ankle 7.1 33.0 46 >40  B Fib    10.9  5.1  Poplt B Fib 1.8 10.0 56 >40  Poplt    12.7  5.0         Right Tibial Motor (Abd Hall Brev)  35.5C  Ankle    3.6 <6.0 17.6 >4 Knee Ankle 9.1 39.0 43  >40  Knee    12.7  9.7          H Reflex Studies   NR H-Lat (ms) Lat Norm (ms) L-R H-Lat (ms)  Right Tibial (Gastroc)  35.5C     32.93 <35    EMG   Side Muscle Ins Act Fibs Psw Fasc Number Recrt Dur Dur. Amp Amp. Poly Poly. Comment  Right AntTibialis Nml Nml Nml Nml Nml Nml Nml Nml Nml Nml Nml Nml N/A  Right Gastroc Nml Nml Nml Nml 1- Mod-R Some 1+ Some 1+ Nml Nml N/A  Right Flex Dig Long Nml Nml Nml Nml Nml Nml Nml Nml Nml Nml Nml Nml N/A  Right RectFemoris Nml Nml Nml Nml Nml Nml Nml Nml Nml Nml Nml Nml N/A  Right GluteusMed Nml Nml Nml Nml Nml Nml Nml Nml Nml Nml Nml Nml N/A  Right BicepsFemS Nml Nml Nml Nml 1- Mod-R Few 1+ Few 1+ Nml Nml N/A  Left Gastroc Nml Nml Nml Nml Nml Nml Nml Nml Nml Nml Nml Nml N/A      Waveforms:

## 2014-12-17 ENCOUNTER — Telehealth: Payer: Self-pay | Admitting: Sports Medicine

## 2014-12-17 NOTE — Telephone Encounter (Signed)
I spoke with the patient on the phone today after reviewing the EMG/nerve conduction study results that she had done on her right lower extremity. The EMG does confirm a chronic S1 radiculopathy affecting the right lower extremity. This fits her clinical exam as well as. I discussed treatment options including lumbar epidural steroid injections and neurosurgical referral. She is leaning towards the epidural steroid injections but she would like to think about this. If she would like to proceed with the epidurals she will call me and [facility] will [facility]. Otherwise, follow-up as needed.

## 2015-05-27 DIAGNOSIS — H2512 Age-related nuclear cataract, left eye: Secondary | ICD-10-CM | POA: Insufficient documentation

## 2015-06-10 DIAGNOSIS — H2511 Age-related nuclear cataract, right eye: Secondary | ICD-10-CM | POA: Insufficient documentation

## 2015-08-19 ENCOUNTER — Other Ambulatory Visit: Payer: Self-pay | Admitting: *Deleted

## 2015-08-19 DIAGNOSIS — G8929 Other chronic pain: Secondary | ICD-10-CM

## 2015-08-19 DIAGNOSIS — M545 Low back pain, unspecified: Secondary | ICD-10-CM

## 2015-08-20 ENCOUNTER — Other Ambulatory Visit: Payer: Self-pay | Admitting: Sports Medicine

## 2015-08-20 DIAGNOSIS — M545 Low back pain, unspecified: Secondary | ICD-10-CM

## 2015-08-20 DIAGNOSIS — G8929 Other chronic pain: Secondary | ICD-10-CM

## 2015-08-31 ENCOUNTER — Ambulatory Visit
Admission: RE | Admit: 2015-08-31 | Discharge: 2015-08-31 | Disposition: A | Discharge status: Home / Self Care | Payer: 59 | Source: Ambulatory Visit | Attending: Sports Medicine | Admitting: Sports Medicine

## 2015-08-31 VITALS — BP 202/92 | HR 66

## 2015-08-31 DIAGNOSIS — M545 Low back pain, unspecified: Secondary | ICD-10-CM

## 2015-08-31 DIAGNOSIS — H2511 Age-related nuclear cataract, right eye: Secondary | ICD-10-CM

## 2015-08-31 DIAGNOSIS — H2512 Age-related nuclear cataract, left eye: Secondary | ICD-10-CM

## 2015-08-31 DIAGNOSIS — G8929 Other chronic pain: Secondary | ICD-10-CM

## 2015-08-31 MED ORDER — IOPAMIDOL (ISOVUE-M 200) INJECTION 41%
1.0000 mL | Freq: Once | INTRAMUSCULAR | Status: AC
  Administered 2015-08-31: 1 mL via EPIDURAL

## 2015-08-31 MED ORDER — METHYLPREDNISOLONE ACETATE 40 MG/ML INJ SUSP (RADIOLOG
120.0000 mg | Freq: Once | INTRAMUSCULAR | Status: AC
  Administered 2015-08-31: 120 mg via EPIDURAL

## 2015-08-31 NOTE — Discharge Instructions (Signed)

## 2015-11-26 ENCOUNTER — Other Ambulatory Visit: Payer: Self-pay | Admitting: Family Medicine

## 2015-11-26 DIAGNOSIS — Z136 Encounter for screening for cardiovascular disorders: Secondary | ICD-10-CM

## 2015-12-06 ENCOUNTER — Ambulatory Visit
Admission: RE | Admit: 2015-12-06 | Discharge: 2015-12-06 | Disposition: A | Discharge status: Home / Self Care | Payer: 59 | Source: Ambulatory Visit | Attending: Family Medicine | Admitting: Family Medicine

## 2015-12-06 DIAGNOSIS — Z136 Encounter for screening for cardiovascular disorders: Secondary | ICD-10-CM

## 2016-02-19 DIAGNOSIS — J209 Acute bronchitis, unspecified: Secondary | ICD-10-CM | POA: Diagnosis not present

## 2016-02-19 DIAGNOSIS — J0140 Acute pansinusitis, unspecified: Secondary | ICD-10-CM | POA: Diagnosis not present

## 2016-02-25 DIAGNOSIS — G4700 Insomnia, unspecified: Secondary | ICD-10-CM | POA: Diagnosis not present

## 2016-02-25 DIAGNOSIS — J069 Acute upper respiratory infection, unspecified: Secondary | ICD-10-CM | POA: Diagnosis not present

## 2016-05-01 DIAGNOSIS — J45909 Unspecified asthma, uncomplicated: Secondary | ICD-10-CM | POA: Diagnosis not present

## 2016-06-12 DIAGNOSIS — I10 Essential (primary) hypertension: Secondary | ICD-10-CM | POA: Diagnosis not present

## 2016-06-12 DIAGNOSIS — E038 Other specified hypothyroidism: Secondary | ICD-10-CM | POA: Diagnosis not present

## 2016-06-16 DIAGNOSIS — H401331 Pigmentary glaucoma, bilateral, mild stage: Secondary | ICD-10-CM | POA: Diagnosis not present

## 2016-08-10 DIAGNOSIS — Z1231 Encounter for screening mammogram for malignant neoplasm of breast: Secondary | ICD-10-CM | POA: Diagnosis not present

## 2016-09-18 DIAGNOSIS — H00015 Hordeolum externum left lower eyelid: Secondary | ICD-10-CM | POA: Diagnosis not present

## 2016-10-26 ENCOUNTER — Ambulatory Visit (INDEPENDENT_AMBULATORY_CARE_PROVIDER_SITE_OTHER): Payer: 59 | Admitting: Podiatry

## 2016-10-26 ENCOUNTER — Encounter: Payer: Self-pay | Admitting: Podiatry

## 2016-10-26 DIAGNOSIS — B351 Tinea unguium: Secondary | ICD-10-CM

## 2016-10-26 DIAGNOSIS — L600 Ingrowing nail: Secondary | ICD-10-CM

## 2016-10-26 DIAGNOSIS — L608 Other nail disorders: Secondary | ICD-10-CM | POA: Diagnosis not present

## 2016-10-26 MED ORDER — DOXYCYCLINE HYCLATE 100 MG PO TABS: 100.0000 mg | ORAL_TABLET | Freq: Two times a day (BID) | ORAL | 0 refills | Status: DC

## 2016-10-26 NOTE — Progress Notes (Signed)
Subjective:    Patient ID: Alyssa Blair, female    DOB: [birth year], 61 y.o.   MRN: [id number]  HPI  61 year old female presents the office today for concerns of ingrown tilt to the right big toe which is been ongoing for the last couple weeks. She states the areas were painful to pressure in shoes when she let the this taken care of before the wintertime [place] were closed in shoes. She has not noticed any redness that she has had some swelling on the nail corner. She has a tenderness nails becoming somewhat discolored with yellow discoloration. She has no other concerns today.   Review of Systems  All other systems reviewed and are negative.  Past Medical History:  Diagnosis Date  . Chronic low back pain 06/02/2014  . Hypertension   . Hypothyroid   . Obesity   . Restless leg syndrome     Past Surgical History:  Procedure Laterality Date  . CHOLECYSTECTOMY  2014  . FOOT SURGERY    . ROTATOR CUFF REPAIR  2015  . TONSILLECTOMY    . TUBAL LIGATION Bilateral   . VEIN SURGERY  2012     Current Outpatient Prescriptions:  .  doxepin (SINEQUAN) 10 MG capsule, , Disp: , Rfl:  .  doxycycline (VIBRA-TABS) 100 MG tablet, Take 1 tablet (100 mg total) by mouth 2 (two) times daily., Disp: 20 tablet, Rfl: 0 .  indomethacin (INDOCIN) 50 MG capsule, TAKE 1 CAPSULE TWICE A DAY WITH FOOD, Disp: , Rfl: 0 .  irbesartan-hydrochlorothiazide (AVALIDE) 150-12.5 MG per tablet, Take 1 tablet by mouth daily., Disp: , Rfl:  .  levothyroxine (SYNTHROID, LEVOTHROID) 112 MCG tablet, 1 TABLET IN THE AM ON AN EMPTY STOMACH ONCE A DAY ORALLY, Disp: , Rfl: 2 .  meloxicam (MOBIC) 15 MG tablet, Take 15 mg by mouth daily., Disp: , Rfl: 6 .  pregabalin (LYRICA) 25 MG capsule, One capsule twice a day for 2 weeks, then take 2 capsules twice a day (Patient not taking: Reported on 10/26/2016), Disp: 120 capsule, Rfl: 3 .  traMADol (ULTRAM) 50 MG tablet, Take 50 mg by mouth every 6 (six) hours as needed., Disp: , Rfl:  .   traZODone (DESYREL) 50 MG tablet, Take 50 mg by mouth at bedtime., Disp: , Rfl:   Allergies  Allergen Reactions  . Codeine Hives, Itching and Nausea Only  . Sulfa Antibiotics Hives, Itching and Nausea Only  . Amoxicillin     Social History   Social History  . Marital status: Married    Spouse name: N/A  . Number of children: 2  . Years of education: some coll.   Occupational History  . Not on file.   Social History Main Topics  . Smoking status: Former Smoker    Packs/day: 0.80    Years: 12.00    Types: Cigarettes    Quit date: 06/07/2002  . Smokeless tobacco: Never Used  . Alcohol use No  . Drug use: No  . Sexual activity: Not on file   Other Topics Concern  . Not on file   Social History Narrative   Patient is right handed.   Patient drinks 2-3 cups of caffeine daily.         Objective:   Physical Exam  General: AAO x3, NAD  Dermatological: There is incurvation present both the medial and lateral aspects of bilateral hallux toenails in the right side with the right side worse than the left is tenderness palpation line  nail borders. The very distal lateral aspect of the right hallux toenail is a very small area of purulence. There is no other pus. There is localized edema there is no significant ascending cellulitis. There is no questions or crepitus. There is no malodor. The nail has yellow discoloration and is hypertrophic.   Vascular: Dorsalis Pedis artery and Posterior Tibial artery pedal pulses are 2/4 bilateral with immedate capillary fill time.There is no pain with calf compression, swelling, warmth, erythema.   Neruologic: Grossly intact via light touch bilateral. Protective threshold with Semmes Wienstein monofilament intact to all pedal sites bilateral.   Musculoskeletal: No gross boney pedal deformities bilateral. No pain, crepitus, or limitation noted with foot and ankle range of motion bilateral. Muscular strength 5/5 in all groups tested  bilateral.  Gait: Unassisted, Nonantalgic.      Assessment & Plan:  61 year old female right symptomatic ingrown toenail -Treatment options discussed including all alternatives, risks, and complications -Etiology of symptoms were discussed -At this time, the patient is requesting partial nail removal with chemical matricectomy to the symptomatic portion of the nail. Risks and complications were discussed with the patient for which they understand and written consent was obtained. Under sterile conditions a total of 3 mL of a mixture of 2% lidocaine plain and 0.5% Marcaine plain was infiltrated in a hallux block fashion. Once anesthetized, the skin was prepped in sterile fashion. A tourniquet was then applied. Next the medial and lateral aspect of hallux nail border was then sharply excised making sure to remove the entire offending nail border. Once the nails were ensured to be removed area was debrided and the underlying skin was intact. There is no purulence identified in the procedure. Next phenol was then applied under standard conditions and copiously irrigated. Silvadene was applied. A dry sterile dressing was applied. After application of the dressing the tourniquet was removed and there is found to be an immediate capillary refill time to the digit. The patient tolerated the procedure well any complications. Post procedure instructions were discussed the patient for which he verbally understood. Follow-up in one week for nail check or sooner if any problems are to arise. Discussed signs/symptoms of infection and directed to call the office immediately should any occur or go directly to the emergency room. In the meantime, encouraged to call the office with any questions, concerns, changes symptoms. -Doxycycline -Nail sent for culture to [facility].   Ovid Curd, DPM

## 2016-10-26 NOTE — Patient Instructions (Signed)

## 2016-11-02 ENCOUNTER — Encounter: Payer: Self-pay | Admitting: Podiatry

## 2016-11-02 ENCOUNTER — Ambulatory Visit (INDEPENDENT_AMBULATORY_CARE_PROVIDER_SITE_OTHER): Payer: 59 | Admitting: Podiatry

## 2016-11-02 DIAGNOSIS — L600 Ingrowing nail: Secondary | ICD-10-CM

## 2016-11-02 DIAGNOSIS — Z9889 Other specified postprocedural states: Secondary | ICD-10-CM

## 2016-11-02 NOTE — Patient Instructions (Signed)

## 2016-11-03 NOTE — Progress Notes (Signed)
Subjective: Alyssa Blair is a 61 y.o.  female returns to office today for follow up evaluation after having right Hallux medial and lateral partial nail avulsion performed. Patient has been soaking using epsom salts and applying topical antibiotic covered with bandaid daily. She states the toenails been doing well and not causing any problems or pain. She denies any pus and denies any red streaks. Patient denies fevers, chills, nausea, vomiting. Denies any calf pain, chest pain, SOB.   Objective:  Vitals: Reviewed  General: Well developed, nourished, in no acute distress, alert and oriented x3   Dermatology: Skin is warm, dry and supple bilateral. Right hallux nail borders appears to be clean, dry, with mild granular tissue and surrounding scab. There is faint erythema but there is no ascending cellulitis, warmth, edema, drainage/purulence. The remaining nails appear unremarkable at this time. There are no other lesions or other signs of infection present.  Neurovascular status: Intact. No lower extremity swelling; No pain with calf compression bilateral.  Musculoskeletal: Decreased tenderness to palpation of the medial and lateral hallux nail folds. Muscular strength within normal limits bilateral.   Assesement and Plan: S/p partial nail avulsion, doing well.   -Continue soaking in epsom salts twice a day followed by antibiotic ointment and a band-aid. Can leave uncovered at night. Continue this until completely healed.  -If the area has not healed in 2 weeks, call the office for follow-up appointment, or sooner if any problems arise.  -Monitor for any signs/symptoms of infection. Call the office immediately if any occur or go directly to the emergency room. Call with any questions/concerns. -Awaiting [place] results.   Ovid Curd, DPM

## 2016-12-01 DIAGNOSIS — H35362 Drusen (degenerative) of macula, left eye: Secondary | ICD-10-CM | POA: Diagnosis not present

## 2016-12-13 DIAGNOSIS — I10 Essential (primary) hypertension: Secondary | ICD-10-CM | POA: Diagnosis not present

## 2016-12-13 DIAGNOSIS — E039 Hypothyroidism, unspecified: Secondary | ICD-10-CM | POA: Diagnosis not present

## 2016-12-13 DIAGNOSIS — Z0000 Encounter for general adult medical examination without abnormal findings: Secondary | ICD-10-CM | POA: Diagnosis not present

## 2017-03-23 DIAGNOSIS — J0140 Acute pansinusitis, unspecified: Secondary | ICD-10-CM | POA: Diagnosis not present

## 2017-03-23 DIAGNOSIS — J209 Acute bronchitis, unspecified: Secondary | ICD-10-CM | POA: Diagnosis not present

## 2017-03-23 DIAGNOSIS — I10 Essential (primary) hypertension: Secondary | ICD-10-CM | POA: Diagnosis not present

## 2017-04-06 DIAGNOSIS — H401331 Pigmentary glaucoma, bilateral, mild stage: Secondary | ICD-10-CM | POA: Diagnosis not present

## 2017-08-15 DIAGNOSIS — Z1231 Encounter for screening mammogram for malignant neoplasm of breast: Secondary | ICD-10-CM | POA: Diagnosis not present

## 2017-08-24 DIAGNOSIS — E039 Hypothyroidism, unspecified: Secondary | ICD-10-CM | POA: Diagnosis not present

## 2017-10-02 DIAGNOSIS — K219 Gastro-esophageal reflux disease without esophagitis: Secondary | ICD-10-CM | POA: Diagnosis not present

## 2017-10-02 DIAGNOSIS — R12 Heartburn: Secondary | ICD-10-CM | POA: Diagnosis not present

## 2017-10-02 DIAGNOSIS — R1310 Dysphagia, unspecified: Secondary | ICD-10-CM | POA: Diagnosis not present

## 2017-10-12 DIAGNOSIS — K3189 Other diseases of stomach and duodenum: Secondary | ICD-10-CM | POA: Diagnosis not present

## 2017-10-12 DIAGNOSIS — K210 Gastro-esophageal reflux disease with esophagitis: Secondary | ICD-10-CM | POA: Diagnosis not present

## 2017-10-12 DIAGNOSIS — K228 Other specified diseases of esophagus: Secondary | ICD-10-CM | POA: Diagnosis not present

## 2017-10-12 DIAGNOSIS — K319 Disease of stomach and duodenum, unspecified: Secondary | ICD-10-CM | POA: Diagnosis not present

## 2017-10-22 DIAGNOSIS — Z23 Encounter for immunization: Secondary | ICD-10-CM | POA: Diagnosis not present

## 2017-11-16 DIAGNOSIS — R05 Cough: Secondary | ICD-10-CM | POA: Diagnosis not present

## 2017-11-16 DIAGNOSIS — J018 Other acute sinusitis: Secondary | ICD-10-CM | POA: Diagnosis not present

## 2017-12-09 DIAGNOSIS — R05 Cough: Secondary | ICD-10-CM | POA: Diagnosis not present

## 2018-01-11 DIAGNOSIS — I10 Essential (primary) hypertension: Secondary | ICD-10-CM | POA: Diagnosis not present

## 2018-01-11 DIAGNOSIS — Z0000 Encounter for general adult medical examination without abnormal findings: Secondary | ICD-10-CM | POA: Diagnosis not present

## 2018-01-11 DIAGNOSIS — E7800 Pure hypercholesterolemia, unspecified: Secondary | ICD-10-CM | POA: Diagnosis not present

## 2018-01-11 DIAGNOSIS — E039 Hypothyroidism, unspecified: Secondary | ICD-10-CM | POA: Diagnosis not present

## 2018-03-22 DIAGNOSIS — H401331 Pigmentary glaucoma, bilateral, mild stage: Secondary | ICD-10-CM | POA: Diagnosis not present

## 2018-07-12 DIAGNOSIS — E7800 Pure hypercholesterolemia, unspecified: Secondary | ICD-10-CM | POA: Diagnosis not present

## 2018-07-12 DIAGNOSIS — I10 Essential (primary) hypertension: Secondary | ICD-10-CM | POA: Diagnosis not present

## 2018-07-12 DIAGNOSIS — E039 Hypothyroidism, unspecified: Secondary | ICD-10-CM | POA: Diagnosis not present

## 2018-07-15 DIAGNOSIS — I10 Essential (primary) hypertension: Secondary | ICD-10-CM | POA: Diagnosis not present

## 2018-07-15 DIAGNOSIS — E7800 Pure hypercholesterolemia, unspecified: Secondary | ICD-10-CM | POA: Diagnosis not present

## 2018-07-15 DIAGNOSIS — E669 Obesity, unspecified: Secondary | ICD-10-CM | POA: Diagnosis not present

## 2018-07-15 DIAGNOSIS — E039 Hypothyroidism, unspecified: Secondary | ICD-10-CM | POA: Diagnosis not present

## 2018-08-19 DIAGNOSIS — H00022 Hordeolum internum right lower eyelid: Secondary | ICD-10-CM | POA: Diagnosis not present

## 2018-11-20 ENCOUNTER — Other Ambulatory Visit: Payer: Self-pay

## 2018-11-20 DIAGNOSIS — Z20828 Contact with and (suspected) exposure to other viral communicable diseases: Secondary | ICD-10-CM | POA: Diagnosis not present

## 2018-11-20 DIAGNOSIS — Z20822 Contact with and (suspected) exposure to covid-19: Secondary | ICD-10-CM

## 2018-11-22 LAB — NOVEL CORONAVIRUS, NAA: SARS-CoV-2, NAA: NOT DETECTED

## 2018-11-28 ENCOUNTER — Other Ambulatory Visit: Payer: Self-pay | Admitting: Family Medicine

## 2018-11-28 ENCOUNTER — Ambulatory Visit
Admission: RE | Admit: 2018-11-28 | Discharge: 2018-11-28 | Disposition: A | Discharge status: Home / Self Care | Payer: BC Managed Care – PPO | Source: Ambulatory Visit | Attending: Family Medicine | Admitting: Family Medicine

## 2018-11-28 DIAGNOSIS — R05 Cough: Secondary | ICD-10-CM

## 2018-11-28 DIAGNOSIS — R059 Cough, unspecified: Secondary | ICD-10-CM

## 2018-12-31 DIAGNOSIS — M7632 Iliotibial band syndrome, left leg: Secondary | ICD-10-CM | POA: Diagnosis not present

## 2018-12-31 DIAGNOSIS — M7062 Trochanteric bursitis, left hip: Secondary | ICD-10-CM | POA: Diagnosis not present

## 2018-12-31 DIAGNOSIS — M545 Low back pain: Secondary | ICD-10-CM | POA: Diagnosis not present

## 2019-01-29 DIAGNOSIS — Z0000 Encounter for general adult medical examination without abnormal findings: Secondary | ICD-10-CM | POA: Diagnosis not present

## 2019-02-10 DIAGNOSIS — S83282A Other tear of lateral meniscus, current injury, left knee, initial encounter: Secondary | ICD-10-CM | POA: Diagnosis not present

## 2019-02-10 DIAGNOSIS — M5416 Radiculopathy, lumbar region: Secondary | ICD-10-CM | POA: Diagnosis not present

## 2019-02-13 DIAGNOSIS — M25562 Pain in left knee: Secondary | ICD-10-CM | POA: Diagnosis not present

## 2019-02-13 DIAGNOSIS — M545 Low back pain: Secondary | ICD-10-CM | POA: Diagnosis not present

## 2019-02-24 DIAGNOSIS — M5126 Other intervertebral disc displacement, lumbar region: Secondary | ICD-10-CM | POA: Diagnosis not present

## 2019-02-24 DIAGNOSIS — M25562 Pain in left knee: Secondary | ICD-10-CM | POA: Diagnosis not present

## 2019-02-24 DIAGNOSIS — M545 Low back pain: Secondary | ICD-10-CM | POA: Diagnosis not present

## 2019-02-24 DIAGNOSIS — M5416 Radiculopathy, lumbar region: Secondary | ICD-10-CM | POA: Diagnosis not present

## 2019-03-07 DIAGNOSIS — M5126 Other intervertebral disc displacement, lumbar region: Secondary | ICD-10-CM | POA: Diagnosis not present

## 2019-03-07 DIAGNOSIS — M545 Low back pain: Secondary | ICD-10-CM | POA: Diagnosis not present

## 2019-03-07 DIAGNOSIS — M5416 Radiculopathy, lumbar region: Secondary | ICD-10-CM | POA: Diagnosis not present

## 2019-03-25 DIAGNOSIS — M5126 Other intervertebral disc displacement, lumbar region: Secondary | ICD-10-CM | POA: Diagnosis not present

## 2019-03-25 DIAGNOSIS — M545 Low back pain: Secondary | ICD-10-CM | POA: Diagnosis not present

## 2019-03-25 DIAGNOSIS — M5416 Radiculopathy, lumbar region: Secondary | ICD-10-CM | POA: Diagnosis not present

## 2019-03-28 DIAGNOSIS — M545 Low back pain: Secondary | ICD-10-CM | POA: Diagnosis not present

## 2019-03-28 DIAGNOSIS — M5126 Other intervertebral disc displacement, lumbar region: Secondary | ICD-10-CM | POA: Diagnosis not present

## 2019-03-28 DIAGNOSIS — M5416 Radiculopathy, lumbar region: Secondary | ICD-10-CM | POA: Diagnosis not present

## 2019-04-17 DIAGNOSIS — M545 Low back pain: Secondary | ICD-10-CM | POA: Diagnosis not present

## 2019-04-17 DIAGNOSIS — M5126 Other intervertebral disc displacement, lumbar region: Secondary | ICD-10-CM | POA: Diagnosis not present

## 2019-04-17 DIAGNOSIS — M5416 Radiculopathy, lumbar region: Secondary | ICD-10-CM | POA: Diagnosis not present

## 2019-06-13 DIAGNOSIS — H35362 Drusen (degenerative) of macula, left eye: Secondary | ICD-10-CM | POA: Diagnosis not present

## 2019-06-21 DIAGNOSIS — M79671 Pain in right foot: Secondary | ICD-10-CM | POA: Diagnosis not present

## 2019-07-30 DIAGNOSIS — I10 Essential (primary) hypertension: Secondary | ICD-10-CM | POA: Diagnosis not present

## 2019-07-30 DIAGNOSIS — E7800 Pure hypercholesterolemia, unspecified: Secondary | ICD-10-CM | POA: Diagnosis not present

## 2019-07-30 DIAGNOSIS — E039 Hypothyroidism, unspecified: Secondary | ICD-10-CM | POA: Diagnosis not present

## 2019-07-30 DIAGNOSIS — Z131 Encounter for screening for diabetes mellitus: Secondary | ICD-10-CM | POA: Diagnosis not present

## 2019-09-09 ENCOUNTER — Encounter (INDEPENDENT_AMBULATORY_CARE_PROVIDER_SITE_OTHER): Payer: Self-pay

## 2019-09-13 DIAGNOSIS — M25511 Pain in right shoulder: Secondary | ICD-10-CM | POA: Diagnosis not present

## 2019-09-13 DIAGNOSIS — M7541 Impingement syndrome of right shoulder: Secondary | ICD-10-CM | POA: Diagnosis not present

## 2019-09-29 ENCOUNTER — Other Ambulatory Visit: Payer: Self-pay

## 2019-09-29 ENCOUNTER — Encounter (INDEPENDENT_AMBULATORY_CARE_PROVIDER_SITE_OTHER): Payer: Self-pay | Admitting: Family Medicine

## 2019-09-29 ENCOUNTER — Ambulatory Visit (INDEPENDENT_AMBULATORY_CARE_PROVIDER_SITE_OTHER): Payer: BC Managed Care – PPO | Admitting: Family Medicine

## 2019-09-29 VITALS — BP 144/73 | HR 78 | Temp 98.0°F | Ht 66.0 in | Wt 222.0 lb

## 2019-09-29 DIAGNOSIS — F418 Other specified anxiety disorders: Secondary | ICD-10-CM

## 2019-09-29 DIAGNOSIS — Z0289 Encounter for other administrative examinations: Secondary | ICD-10-CM

## 2019-09-29 DIAGNOSIS — E038 Other specified hypothyroidism: Secondary | ICD-10-CM | POA: Diagnosis not present

## 2019-09-29 DIAGNOSIS — I10 Essential (primary) hypertension: Secondary | ICD-10-CM

## 2019-09-29 DIAGNOSIS — R0602 Shortness of breath: Secondary | ICD-10-CM

## 2019-09-29 DIAGNOSIS — Z9189 Other specified personal risk factors, not elsewhere classified: Secondary | ICD-10-CM

## 2019-09-29 DIAGNOSIS — Z6835 Body mass index (BMI) 35.0-35.9, adult: Secondary | ICD-10-CM

## 2019-09-29 DIAGNOSIS — R5383 Other fatigue: Secondary | ICD-10-CM | POA: Diagnosis not present

## 2019-09-30 LAB — CBC WITH DIFFERENTIAL/PLATELET
Basophils Absolute: 0.1 10*3/uL (ref 0.0–0.2)
Basos: 1 %
EOS (ABSOLUTE): 0.2 10*3/uL (ref 0.0–0.4)
Eos: 2 %
Hematocrit: 39.9 % (ref 34.0–46.6)
Hemoglobin: 13.4 g/dL (ref 11.1–15.9)
Immature Grans (Abs): 0 10*3/uL (ref 0.0–0.1)
Immature Granulocytes: 0 %
Lymphocytes Absolute: 1.7 10*3/uL (ref 0.7–3.1)
Lymphs: 21 %
MCH: 30.5 pg (ref 26.6–33.0)
MCHC: 33.6 g/dL (ref 31.5–35.7)
MCV: 91 fL (ref 79–97)
Monocytes Absolute: 0.6 10*3/uL (ref 0.1–0.9)
Monocytes: 8 %
Neutrophils Absolute: 5.6 10*3/uL (ref 1.4–7.0)
Neutrophils: 68 %
Platelets: 205 10*3/uL (ref 150–450)
RBC: 4.4 x10E6/uL (ref 3.77–5.28)
RDW: 13.3 % (ref 11.7–15.4)
WBC: 8.3 10*3/uL (ref 3.4–10.8)

## 2019-09-30 LAB — COMPREHENSIVE METABOLIC PANEL
ALT: 92 IU/L — ABNORMAL HIGH (ref 0–32)
AST: 83 IU/L — ABNORMAL HIGH (ref 0–40)
Albumin/Globulin Ratio: 1.6 (ref 1.2–2.2)
Albumin: 4.3 g/dL (ref 3.8–4.8)
Alkaline Phosphatase: 163 IU/L — ABNORMAL HIGH (ref 48–121)
BUN/Creatinine Ratio: 20 (ref 12–28)
BUN: 16 mg/dL (ref 8–27)
Bilirubin Total: 0.3 mg/dL (ref 0.0–1.2)
CO2: 26 mmol/L (ref 20–29)
Calcium: 9.6 mg/dL (ref 8.7–10.3)
Chloride: 103 mmol/L (ref 96–106)
Creatinine, Ser: 0.79 mg/dL (ref 0.57–1.00)
GFR calc Af Amer: 91 mL/min/{1.73_m2} (ref >59)
GFR calc non Af Amer: 79 mL/min/{1.73_m2} (ref >59)
Globulin, Total: 2.7 g/dL (ref 1.5–4.5)
Glucose: 91 mg/dL (ref 65–99)
Potassium: 4.7 mmol/L (ref 3.5–5.2)
Sodium: 143 mmol/L (ref 134–144)
Total Protein: 7 g/dL (ref 6.0–8.5)

## 2019-09-30 LAB — FOLATE: Folate: 18.3 ng/mL (ref >3.0)

## 2019-09-30 LAB — T3: T3, Total: 125 ng/dL (ref 71–180)

## 2019-09-30 LAB — T4: T4, Total: 8.9 ug/dL (ref 4.5–12.0)

## 2019-09-30 LAB — VITAMIN D 25 HYDROXY (VIT D DEFICIENCY, FRACTURES): Vit D, 25-Hydroxy: 37.3 ng/mL (ref 30.0–100.0)

## 2019-09-30 LAB — INSULIN, RANDOM: INSULIN: 15.4 u[IU]/mL (ref 2.6–24.9)

## 2019-09-30 LAB — VITAMIN B12: Vitamin B-12: 851 pg/mL (ref 232–1245)

## 2019-10-01 NOTE — Progress Notes (Signed)
Dear Lupita Raider, MD,   Thank you for referring Alyssa Blair to our clinic. The following note includes my evaluation and treatment recommendations.  Chief Complaint:   OBESITY Alyssa Blair (MR# [id number]) is a 64 y.o. female who presents for evaluation and treatment of obesity and related comorbidities. Current BMI is Body mass index is 35.83 kg/m. Alyssa Blair has been struggling with her weight for many years and has been unsuccessful in either losing weight, maintaining weight loss, or reaching her healthy weight goal.  Alyssa Blair's weight has been elevated over the past 10 years. Her husband is a Naval architect. She is skipping lunch most days. Coffee with sugar (1 spoonful), and bowl of cereal (3 cups) with 2% milk 2 cups) feels full. She has a banana smoothie with peanut butter, honey, and a protein bar. Around 3pm, snack is cheese, and dinner is 3 oz meat, and veggies. After dinner is ice cream.  Alyssa Blair is currently in the action stage of change and ready to dedicate time achieving and maintaining a healthier weight. Alyssa Blair is interested in becoming our patient and working on intensive lifestyle modifications including (but not limited to) diet and exercise for weight loss.  Alyssa Blair's habits were reviewed today and are as follows: Her family eats meals together, she thinks her family will eat healthier with her, she started gaining weight after certain medications, she has significant food cravings issues, she snacks frequently in the evenings, she skips meals frequently, she is frequently drinking liquids with calories, she frequently eats larger portions than normal and she struggles with emotional eating.  Depression Screen Alyssa Blair's Food and Mood (modified PHQ-9) score was 11.  Depression screen PHQ 2/9 09/29/2019  Decreased Interest 2  Down, Depressed, Hopeless 1  PHQ - 2 Score 3  Altered sleeping 2  Tired, decreased energy 3  Change in appetite 2  Feeling bad or failure about yourself  0    Trouble concentrating 1  Moving slowly or fidgety/restless 0  Suicidal thoughts 0  PHQ-9 Score 11  Difficult doing work/chores Not difficult at all   Subjective:   1. Other fatigue Alyssa Blair admits to daytime somnolence and admits to waking up still tired. Patent has a history of symptoms of daytime fatigue and morning headache. Alyssa Blair generally gets 5 or 7 hours of sleep per night, and states that she has nightime awakenings. Snoring is present. Apneic episodes are not present. Epworth Sleepiness Score is 7. EKG-normal sinus rhythm at 66 BPM with poor  R wave progression.  2. SOB (shortness of breath) on exertion Alyssa Blair notes increasing shortness of breath with exercising and seems to be worsening over time with weight gain. She notes getting out of breath sooner with activity than she used to. This has not gotten worse recently. Alyssa Blair denies shortness of breath at rest or orthopnea.  3. Other specified hypothyroidism Alyssa Blair is on levothyroxine 112 mcg daily. Last TSH was within normal limits. She denies cold/heat intolerance or palpitations.  4. Essential hypertension Alyssa Blair's blood pressure is slightly elevated today. She is on hydrochlorothiazide 12.5 mg daily.  5. Depression with anxiety Alyssa Blair is on Effexor 75 mg, and her symptoms are well managed. She denies suicidal ideas or homicidal ideas.  6. At risk for heart disease Alyssa Blair is at a higher than average risk for cardiovascular disease due to obesity.   Assessment/Plan:   1. Other fatigue Alyssa Blair does feel that her weight is causing her energy to be lower than it should be.  Fatigue may be related to obesity, depression or many other causes. Labs will be ordered, and in the meanwhile, Alyssa Blair will focus on self care including making healthy food choices, increasing physical activity and focusing on stress reduction.  - EKG 12-Lead - Vitamin B12 - Folate - Insulin, random - VITAMIN D 25 Hydroxy (Vit-D Deficiency, Fractures) - T4 - T3  2. SOB  (shortness of breath) on exertion Alyssa Blair does feel that she gets out of breath more easily that she used to when she exercises. Alyssa Blair's shortness of breath appears to be obesity related and exercise induced. She has agreed to work on weight loss and gradually increase exercise to treat her exercise induced shortness of breath. Will continue to monitor closely.  - CBC with Differential/Platelet  3. Other specified hypothyroidism Patient with long-standing hypothyroidism, on levothyroxine therapy. Alyssa Blair appears euthyroid. We will check labs today. Orders and follow up as documented in patient record.  Counseling . Good thyroid control is important for overall health. Supratherapeutic thyroid levels are dangerous and will not improve weight loss results. . The correct way to take levothyroxine is fasting, with water, separated by at least 30 minutes from breakfast, and separated by more than 4 hours from calcium, iron, multivitamins, acid reflux medications (PPIs).   4. Essential hypertension Alyssa Blair is working on healthy weight loss and exercise to improve blood pressure control. We will watch for signs of hypotension as she continues her lifestyle modifications. We will check labs today. We will follow up on her blood pressure at her next appointment.  - Comprehensive metabolic panel  5. Depression with anxiety Behavior modification techniques were discussed today to help Alyssa Blair deal with her emotional/non-hunger eating behaviors. We will follow up at her next appointment. Orders and follow up as documented in patient record.   6. At risk for heart disease Alyssa Blair was given approximately 15 minutes of coronary artery disease prevention counseling today. She is 64 y.o. female and has risk factors for heart disease including obesity. We discussed intensive lifestyle modifications today with an emphasis on specific weight loss instructions and strategies.   Repetitive spaced learning was employed today to  elicit superior memory formation and behavioral change.  7. Class 2 severe obesity with serious comorbidity and body mass index (BMI) of 35.0 to 35.9 in adult, unspecified obesity type (HCC) Delania is currently in the action stage of change and her goal is to continue with weight loss efforts. I recommend Ledora begin the structured treatment plan as follows:  She has agreed to the [employer] + 300 calories.  Exercise goals: No exercise has been prescribed at this time.   Behavioral modification strategies: increasing lean protein intake, meal planning and cooking strategies, keeping healthy foods in the home and better snacking choices.  She was informed of the importance of frequent follow-up visits to maximize her success with intensive lifestyle modifications for her multiple health conditions. She was informed we would discuss her lab results at her next visit unless there is a critical issue that needs to be addressed sooner. Jessamy agreed to keep her next visit at the agreed upon time to discuss these results.  Objective:   Blood pressure (!) 144/73, pulse 78, temperature 98 F (36.7 C), temperature source Oral, height 5\' 6"  (1.676 m), weight 222 lb (100.7 kg), SpO2 93 %. Body mass index is 35.83 kg/m.  EKG: Normal sinus rhythm, rate 66 BPM.  Indirect Calorimeter completed today shows a VO2 of 270 and a REE of 1879.  Her calculated basal metabolic rate is 7035 thus her basal metabolic rate is better than expected.  General: Cooperative, alert, well developed, in no acute distress. HEENT: Conjunctivae and lids unremarkable. Cardiovascular: Regular rhythm.  Lungs: Normal work of breathing. Neurologic: No focal deficits.   Lab Results  Component Value Date   CREATININE 0.79 09/29/2019   BUN 16 09/29/2019   NA 143 09/29/2019   K 4.7 09/29/2019   CL 103 09/29/2019   CO2 26 09/29/2019   Lab Results  Component Value Date   ALT 92 (H) 09/29/2019   AST 83 (H) 09/29/2019    ALKPHOS 163 (H) 09/29/2019   BILITOT 0.3 09/29/2019   No results found for: HGBA1C Lab Results  Component Value Date   INSULIN 15.4 09/29/2019   Lab Results  Component Value Date   TSH 6.235  06/18/2008   No results found for: CHOL, HDL, LDLCALC, LDLDIRECT, TRIG, CHOLHDL Lab Results  Component Value Date   WBC 8.3 09/29/2019   HGB 13.4 09/29/2019   HCT 39.9 09/29/2019   MCV 91 09/29/2019   PLT 205 09/29/2019   No results found for: IRON, TIBC, FERRITIN  Attestation Statements:   Reviewed by clinician on day of visit: allergies, medications, problem list, medical history, surgical history, family history, social history, and previous encounter notes.   I, Burt Knack, am acting as transcriptionist for Reuben Likes, MD.  This is the patient's first visit at [facility]. The patient's NEW PATIENT PACKET was reviewed at length. Included in the packet: current and past health history, medications, allergies, ROS, gynecologic history (women only), surgical history, family history, social history, weight history, weight loss surgery history (for those that have had weight loss surgery), nutritional evaluation, mood and food questionnaire, PHQ9, Epworth questionnaire, sleep habits questionnaire, patient life and health improvement goals questionnaire. These will all be scanned into the patient's chart under media.   During the visit, I independently reviewed the patient's EKG, bioimpedance scale results, and indirect calorimeter results. I used this information to tailor a meal plan for the patient that will help her to lose weight and will improve her obesity-related conditions going forward. I performed a medically necessary appropriate examination and/or evaluation. I discussed the assessment and treatment plan with the patient. The patient was provided an opportunity to ask questions and all were answered. The patient agreed with the plan and demonstrated an  understanding of the instructions. Labs were ordered at this visit and will be reviewed at the next visit unless more critical results need to be addressed immediately. Clinical information was updated and documented in the EMR.   Time spent on visit including pre-visit chart review and post-visit care was 45 minutes.   A separate 15 minutes was spent on risk counseling (see above).

## 2019-10-02 ENCOUNTER — Encounter (INDEPENDENT_AMBULATORY_CARE_PROVIDER_SITE_OTHER): Payer: Self-pay | Admitting: Family Medicine

## 2019-10-02 NOTE — Telephone Encounter (Signed)
Please advise 

## 2019-10-03 DIAGNOSIS — M25511 Pain in right shoulder: Secondary | ICD-10-CM | POA: Diagnosis not present

## 2019-10-03 DIAGNOSIS — M542 Cervicalgia: Secondary | ICD-10-CM | POA: Diagnosis not present

## 2019-10-07 ENCOUNTER — Encounter (INDEPENDENT_AMBULATORY_CARE_PROVIDER_SITE_OTHER): Payer: Self-pay | Admitting: Family Medicine

## 2019-10-07 NOTE — Telephone Encounter (Signed)
Please advise 

## 2019-10-13 ENCOUNTER — Ambulatory Visit (INDEPENDENT_AMBULATORY_CARE_PROVIDER_SITE_OTHER): Payer: BC Managed Care – PPO | Admitting: Family Medicine

## 2019-10-13 ENCOUNTER — Other Ambulatory Visit: Payer: Self-pay

## 2019-10-13 ENCOUNTER — Encounter (INDEPENDENT_AMBULATORY_CARE_PROVIDER_SITE_OTHER): Payer: Self-pay | Admitting: Family Medicine

## 2019-10-13 VITALS — BP 137/78 | HR 80 | Temp 98.1°F | Ht 66.0 in | Wt 221.0 lb

## 2019-10-13 DIAGNOSIS — E038 Other specified hypothyroidism: Secondary | ICD-10-CM | POA: Diagnosis not present

## 2019-10-13 DIAGNOSIS — Z9189 Other specified personal risk factors, not elsewhere classified: Secondary | ICD-10-CM | POA: Diagnosis not present

## 2019-10-13 DIAGNOSIS — E559 Vitamin D deficiency, unspecified: Secondary | ICD-10-CM

## 2019-10-13 DIAGNOSIS — E88819 Insulin resistance, unspecified: Secondary | ICD-10-CM

## 2019-10-13 DIAGNOSIS — Z6835 Body mass index (BMI) 35.0-35.9, adult: Secondary | ICD-10-CM

## 2019-10-13 DIAGNOSIS — E8881 Metabolic syndrome: Secondary | ICD-10-CM

## 2019-10-13 MED ORDER — VITAMIN D (ERGOCALCIFEROL) 1.25 MG (50000 UNIT) PO CAPS: 50000.0000 [IU] | ORAL_CAPSULE | ORAL | 0 refills | Status: DC

## 2019-10-13 MED ORDER — WEGOVY 0.25 MG/0.5ML ~~LOC~~ SOAJ: 0.2500 mg | SUBCUTANEOUS | 0 refills | Status: DC

## 2019-10-14 ENCOUNTER — Encounter (INDEPENDENT_AMBULATORY_CARE_PROVIDER_SITE_OTHER): Payer: Self-pay | Admitting: Family Medicine

## 2019-10-14 NOTE — Progress Notes (Signed)
Chief Complaint:   OBESITY Alyssa Blair is here to discuss her progress with her obesity treatment plan along with follow-up of her obesity related diagnoses. Alyssa Blair is on the [employer] + 300 calories and states she is following her eating plan approximately 85-90% of the time. Alyssa Blair states she is walking the puppy for 60 minutes 7 times per week.  Today's visit was #: 2 Starting weight: 222 lbs Starting date: 09/29/2019 Today's weight: 221 lbs Today's date: 10/13/2019 Total lbs lost to date: 1 Total lbs lost since last in-office visit: 1  Interim History: Alyssa Blair liked a good amount of the food but she is looking for a different meal plan because she is tired of fixing 2 separate meals. She found some of the meal plan products were expensive. She is getting in 220 snack calories per day. She is looking for more variety. She is disappointed with her 1 lb weight loss.  Subjective:   1. Vitamin D deficiency Alyssa Blair's last Vit D level was 37.3. She is not on Vit D, and she notes fatigue I discussed labs with the patient today.  2. Insulin resistance Alyssa Blair's last A1c was within normal limits per the patient. Last insulin level was 15.4. I discussed labs with the patient today.  3. Other specified hypothyroidism Alyssa Blair's last TSH was within normal limits, T3 and T4 were within normal limits. She is levothyroxine 112 mcg. She denies cold/heat intolerance or palpitations.   4. At risk for side effect of medication Alyssa Blair is at risk for drug side effects due to starting [facility] (risk of nausea, vomiting, and feeling full).  Assessment/Plan:   1. Vitamin D deficiency Low Vitamin D level contributes to fatigue and are associated with obesity, breast, and colon cancer. Alyssa Blair agreed to start prescription Vitamin D 50,000 IU every week with no refills. She will follow-up for routine testing of Vitamin D, at least 2-3 times per year to avoid over-replacement.  - Vitamin D, Ergocalciferol, (DRISDOL) 1.25 MG  (50000 UNIT) CAPS capsule; Take 1 capsule (50,000 Units total) by mouth every 7 (seven) days.  Dispense: 4 capsule; Refill: 0  2. Insulin resistance Alyssa Blair will continue to work on weight loss, exercise, and decreasing simple carbohydrates to help decrease the risk of diabetes. We will repeat labs in 3 months, no medications at this time except Wegovy, as she is starting this for her obesity. Alyssa Blair agreed to follow-up with [place] as directed to closely monitor her progress.  3. Other specified hypothyroidism Patient with long-standing hypothyroidism, on levothyroxine therapy. Alyssa Blair appears euthyroid. We will follow up on labs in 3 months with weight loss, may need medications dose adjustment. Orders and follow up as documented in patient record.  Counseling . Good thyroid control is important for overall health. Alyssa Blair thyroid levels are dangerous and will not improve weight loss results. . The correct way to take levothyroxine is fasting, with water, separated by at least 30 minutes from breakfast, and separated by more than 4 hours from calcium, iron, multivitamins, acid reflux medications (PPIs).   4. At risk for side effect of medication Alyssa Blair was given approximately 30 minutes of drug side effect counseling today. We discussed side effect possibility and risk versus benefits. Alyssa Blair agreed to the medication and will contact this office if these side effects are intolerable.  Repetitive spaced learning was employed today to elicit superior memory formation and behavioral change.  5. Class 2 severe obesity with serious comorbidity and body mass index (BMI) of 35.0  to 35.9 in adult, unspecified obesity type Alyssa Blair) Alyssa Blair is currently in the action stage of change. As such, her goal is to continue with weight loss efforts. She has agreed to the Category 3 Plan or the Pescatarian Plan + 300 calories.   We discussed various medication options to help Alyssa Blair with her weight loss efforts and we both  agreed to start Wegovy 0.25 mg SubQ weekly with no refills.  - Semaglutide-Weight Management (WEGOVY) 0.25 MG/0.5ML SOAJ; Inject 0.25 mg into the skin once a week.  Dispense: 2 mL; Refill: 0  Exercise goals: All adults should avoid inactivity. Some physical activity is better than none, and adults who participate in any amount of physical activity gain some health benefits.  Behavioral modification strategies: increasing lean protein intake, meal planning and cooking strategies, keeping healthy foods in the home and planning for success.  Alyssa Blair has agreed to follow-up with our clinic in 2 weeks. She was informed of the importance of frequent follow-up visits to maximize her success with intensive lifestyle modifications for her multiple health conditions.   Objective:   Blood pressure 137/78, pulse 80, temperature 98.1 F (36.7 C), temperature source Oral, height 5\' 6"  (1.676 m), weight 221 lb (100.2 kg), SpO2 95 %. Body mass index is 35.67 kg/m.  General: Cooperative, alert, well developed, in no acute distress. HEENT: Conjunctivae and lids unremarkable. Cardiovascular: Regular rhythm.  Lungs: Normal work of breathing. Neurologic: No focal deficits.   Lab Results  Component Value Date   CREATININE 0.79 09/29/2019   BUN 16 09/29/2019   NA 143 09/29/2019   K 4.7 09/29/2019   CL 103 09/29/2019   CO2 26 09/29/2019   Lab Results  Component Value Date   ALT 92 (H) 09/29/2019   AST 83 (H) 09/29/2019   ALKPHOS 163 (H) 09/29/2019   BILITOT 0.3 09/29/2019   No results found for: HGBA1C Lab Results  Component Value Date   INSULIN 15.4 09/29/2019   Lab Results  Component Value Date   TSH 6.235  06/18/2008   No results found for: CHOL, HDL, LDLCALC, LDLDIRECT, TRIG, CHOLHDL Lab Results  Component Value Date   WBC 8.3 09/29/2019   HGB 13.4 09/29/2019   HCT 39.9 09/29/2019   MCV 91 09/29/2019   PLT 205 09/29/2019   No results found for: IRON, TIBC, FERRITIN   Attestation  Statements:   Reviewed by clinician on day of visit: allergies, medications, problem list, medical history, surgical history, family history, social history, and previous encounter notes.   I, 10/01/2019, am acting as transcriptionist for Burt Knack, MD.  I have reviewed the above documentation for accuracy and completeness, and I agree with the above. - Reuben Likes, MD

## 2019-10-29 ENCOUNTER — Ambulatory Visit (INDEPENDENT_AMBULATORY_CARE_PROVIDER_SITE_OTHER): Payer: BC Managed Care – PPO | Admitting: Family Medicine

## 2019-10-29 ENCOUNTER — Encounter (INDEPENDENT_AMBULATORY_CARE_PROVIDER_SITE_OTHER): Payer: Self-pay | Admitting: Family Medicine

## 2019-10-29 ENCOUNTER — Other Ambulatory Visit: Payer: Self-pay

## 2019-10-29 VITALS — BP 127/82 | HR 69 | Temp 98.1°F | Ht 66.0 in | Wt 218.0 lb

## 2019-10-29 DIAGNOSIS — E038 Other specified hypothyroidism: Secondary | ICD-10-CM | POA: Diagnosis not present

## 2019-10-29 DIAGNOSIS — Z6835 Body mass index (BMI) 35.0-35.9, adult: Secondary | ICD-10-CM

## 2019-10-29 DIAGNOSIS — E559 Vitamin D deficiency, unspecified: Secondary | ICD-10-CM | POA: Diagnosis not present

## 2019-10-29 DIAGNOSIS — Z9189 Other specified personal risk factors, not elsewhere classified: Secondary | ICD-10-CM | POA: Diagnosis not present

## 2019-10-29 MED ORDER — WEGOVY 0.5 MG/0.5ML ~~LOC~~ SOAJ: 0.5000 mg | SUBCUTANEOUS | 0 refills | Status: DC

## 2019-10-29 MED ORDER — VITAMIN D (ERGOCALCIFEROL) 1.25 MG (50000 UNIT) PO CAPS: 50000.0000 [IU] | ORAL_CAPSULE | ORAL | 0 refills | Status: DC

## 2019-10-29 NOTE — Progress Notes (Signed)
Chief Complaint:   OBESITY Alyssa Blair is here to discuss her progress with her obesity treatment plan along with follow-up of her obesity related diagnoses. Alyssa Blair is on the Category 3 Plan or the Pescatarian Plan + 300 calories and states she is following her eating plan approximately 85-90% of the time. Alyssa Blair states she is walking 6,000 steps 5 times per week.  Today's visit was #: 3 Starting weight: 222 lbs Starting date: 09/29/2019 Today's weight: 218 lbs Today's date: 10/29/2019 Total lbs lost to date: 4 Total lbs lost since last in-office visit: 3  Interim History: Alyssa Blair doesn't always get all of the food in. She started Alyssa Blair with no side effects. She is getting most food in often its dinner. She has no plans for the upcoming weeks. She doesn't feel as bloated. She is not as interested in increasing meat to change to Category 3.  Subjective:   1. Vitamin D deficiency Alyssa Blair denies nausea, vomiting, or muscle weakness, but she notes fatigue. Last Vit D level was 37.3.  2. Other specified hypothyroidism Alyssa Blair denies cold/heat intolerance. She is on levothyroxine.  3. At risk for deficient intake of food The patient is at a higher than average risk of deficient intake of food due to not always able to get all food in even without Wegovy.  Assessment/Plan:   1. Vitamin D deficiency Low Vitamin D level contributes to fatigue and are associated with obesity, breast, and colon cancer. We will refill prescription Vitamin D for 1 month. Alyssa Blair will follow-up for routine testing of Vitamin D, at least 2-3 times per year to avoid over-replacement.  - Vitamin D, Ergocalciferol, (DRISDOL) 1.25 MG (50000 UNIT) CAPS capsule; Take 1 capsule (50,000 Units total) by mouth every 7 (seven) days.  Dispense: 4 capsule; Refill: 0  2. Other specified hypothyroidism Patient with long-standing hypothyroidism, on levothyroxine therapy. She appears euthyroid. Alyssa Blair will continue levothyroxine with no change in  dose. Orders and follow up as documented in patient record.  Counseling . Good thyroid control is important for overall health. Supratherapeutic thyroid levels are dangerous and will not improve weight loss results. . The correct way to take levothyroxine is fasting, with water, separated by at least 30 minutes from breakfast, and separated by more than 4 hours from calcium, iron, multivitamins, acid reflux medications (PPIs).   3. At risk for deficient intake of food Alyssa Blair was given approximately 15 minutes of deficit intake of food prevention counseling today. Alyssa Blair is at risk for eating too few calories based on current food recall. She was encouraged to focus on meeting caloric and protein goals according to her recommended meal plan.   4. Class 2 severe obesity with serious comorbidity and body mass index (BMI) of 35.0 to 35.9 in adult, unspecified obesity type (HCC) Alyssa Blair is currently in the action stage of change. As such, her goal is to continue with weight loss efforts. She has agreed to the [employer] + 300 calories.   We discussed various medication options to help Alyssa Blair with her weight loss efforts and we both agreed to increase Wegovy to 0.50 mg SubQ weekly, with no refill.  - Semaglutide-Weight Management (WEGOVY) 0.5 MG/0.5ML SOAJ; Inject 0.5 mg into the skin once a week.  Dispense: 2 mL; Refill: 0  Exercise goals: All adults should avoid inactivity. Some physical activity is better than none, and adults who participate in any amount of physical activity gain some health benefits.  Behavioral modification strategies: increasing lean protein intake,  increasing vegetables, meal planning and cooking strategies and keeping healthy foods in the home.  Alyssa Blair has agreed to follow-up with our clinic in 2 weeks. She was informed of the importance of frequent follow-up visits to maximize her success with intensive lifestyle modifications for her multiple health conditions.   Objective:    Blood pressure 127/82, pulse 69, temperature 98.1 F (36.7 C), temperature source Oral, height 5\' 6"  (1.676 m), weight 218 lb (98.9 kg), SpO2 94 %. Body mass index is 35.19 kg/m.  General: Cooperative, alert, well developed, in no acute distress. HEENT: Conjunctivae and lids unremarkable. Cardiovascular: Regular rhythm.  Lungs: Normal work of breathing. Neurologic: No focal deficits.   Lab Results  Component Value Date   CREATININE 0.79 09/29/2019   BUN 16 09/29/2019   NA 143 09/29/2019   K 4.7 09/29/2019   CL 103 09/29/2019   CO2 26 09/29/2019   Lab Results  Component Value Date   ALT 92 (H) 09/29/2019   AST 83 (H) 09/29/2019   ALKPHOS 163 (H) 09/29/2019   BILITOT 0.3 09/29/2019   No results found for: HGBA1C Lab Results  Component Value Date   INSULIN 15.4 09/29/2019   Lab Results  Component Value Date   TSH 6.235  06/18/2008   No results found for: CHOL, HDL, LDLCALC, LDLDIRECT, TRIG, CHOLHDL Lab Results  Component Value Date   WBC 8.3 09/29/2019   HGB 13.4 09/29/2019   HCT 39.9 09/29/2019   MCV 91 09/29/2019   PLT 205 09/29/2019   No results found for: IRON, TIBC, FERRITIN  Attestation Statements:   Reviewed by clinician on day of visit: allergies, medications, problem list, medical history, surgical history, family history, social history, and previous encounter notes.   I, 10/01/2019, am acting as transcriptionist for Burt Knack, MD.  I have reviewed the above documentation for accuracy and completeness, and I agree with the above. - Reuben Likes, MD

## 2019-11-04 ENCOUNTER — Other Ambulatory Visit (INDEPENDENT_AMBULATORY_CARE_PROVIDER_SITE_OTHER): Payer: Self-pay | Admitting: Family Medicine

## 2019-11-04 DIAGNOSIS — E559 Vitamin D deficiency, unspecified: Secondary | ICD-10-CM

## 2019-11-13 ENCOUNTER — Ambulatory Visit (INDEPENDENT_AMBULATORY_CARE_PROVIDER_SITE_OTHER): Payer: BC Managed Care – PPO | Admitting: Family Medicine

## 2019-11-18 ENCOUNTER — Ambulatory Visit (INDEPENDENT_AMBULATORY_CARE_PROVIDER_SITE_OTHER): Payer: BC Managed Care – PPO | Admitting: Family Medicine

## 2019-11-18 ENCOUNTER — Encounter (INDEPENDENT_AMBULATORY_CARE_PROVIDER_SITE_OTHER): Payer: Self-pay | Admitting: Family Medicine

## 2019-11-18 ENCOUNTER — Other Ambulatory Visit: Payer: Self-pay

## 2019-11-18 VITALS — BP 120/74 | HR 67 | Temp 98.1°F | Ht 66.0 in | Wt 214.0 lb

## 2019-11-18 DIAGNOSIS — Z6834 Body mass index (BMI) 34.0-34.9, adult: Secondary | ICD-10-CM

## 2019-11-18 DIAGNOSIS — E559 Vitamin D deficiency, unspecified: Secondary | ICD-10-CM | POA: Diagnosis not present

## 2019-11-18 DIAGNOSIS — E669 Obesity, unspecified: Secondary | ICD-10-CM

## 2019-11-18 DIAGNOSIS — R7401 Elevation of levels of liver transaminase levels: Secondary | ICD-10-CM

## 2019-11-18 DIAGNOSIS — Z1231 Encounter for screening mammogram for malignant neoplasm of breast: Secondary | ICD-10-CM | POA: Diagnosis not present

## 2019-11-25 NOTE — Progress Notes (Signed)
Chief Complaint:   OBESITY Alyssa Blair is here to discuss her progress with her obesity treatment plan along with follow-up of her obesity related diagnoses. [name] is on the Category 3 Plan and the Pescatarian Plan + 300 calories and states she is following her eating plan approximately 90% of the time. [name] states she is walking 5,000-6,000 steps 5 times per week and 10,000 steps 2 times per week.  Today's visit was #: 4 Starting weight: 222 lbs Starting date: 09/29/2019 Today's weight: 214 lbs Today's date: 11/18/2019 Total lbs lost to date: 8 Total lbs lost since last in-office visit: 4  Interim History: [name] is still following the plan most of the time. She has discovered planning next day meals ahead of time helps keep her on track. Some days she feels hungrier. She started [facility] 3 days ago and denies GI side effects. She has a wedding and daughter's birthday coming up.  Subjective:   Transaminitis. LFT's were previously elevated on initial labs and 11 years ago. She has not had a Hepatitis C test.  Vitamin D deficiency. No nausea, vomiting, or muscle weakness. Alyssa Blair endorses fatigue. She is on prescription Vitamin D supplementation. Last Vitamin D level was 37.3.   Ref. Range 09/29/2019 09:30  Vitamin D, 25-Hydroxy Latest Ref Range: 30.0 - 100.0 ng/mL 37.3   Assessment/Plan:   Transaminitis. Hepatitis C test will be done at her next blood draw.  Vitamin D deficiency. Low Vitamin D level contributes to fatigue and are associated with obesity, breast, and colon cancer. She agrees to continue to take prescription Vitamin D as directed and will follow-up for routine testing of Vitamin D, at least 2-3 times per year to avoid over-replacement.  Class 1 obesity with serious comorbidity and body mass index (BMI) of 34.0 to 34.9 in adult, unspecified obesity type.  [name] is currently in the action stage of change. As such, her goal is to continue with weight loss efforts. She has  agreed to the Category 3 Plan.   Exercise goals: Alyssa Blair will continue her current exercise regimen.   Behavioral modification strategies: increasing lean protein intake, meal planning and cooking strategies, keeping healthy foods in the home and planning for success.  Alyssa Blair has agreed to follow-up with our clinic in 2-3 weeks. She was informed of the importance of frequent follow-up visits to maximize her success with intensive lifestyle modifications for her multiple health conditions.   Objective:   Blood pressure 120/74, pulse 67, temperature 98.1 F (36.7 C), temperature source Oral, height 5\' 6"  (1.676 m), weight 214 lb (97.1 kg), SpO2 95 %. Body mass index is 34.54 kg/m.  General: Cooperative, alert, well developed, in no acute distress. HEENT: Conjunctivae and lids unremarkable. Cardiovascular: Regular rhythm.  Lungs: Normal work of breathing. Neurologic: No focal deficits.   Lab Results  Component Value Date   CREATININE 0.79 09/29/2019   BUN 16 09/29/2019   NA 143 09/29/2019   K 4.7 09/29/2019   CL 103 09/29/2019   CO2 26 09/29/2019   Lab Results  Component Value Date   ALT 92 (H) 09/29/2019   AST 83 (H) 09/29/2019   ALKPHOS 163 (H) 09/29/2019   BILITOT 0.3 09/29/2019   No results found for: HGBA1C Lab Results  Component Value Date   INSULIN 15.4 09/29/2019   Lab Results  Component Value Date   TSH 6.235  06/18/2008   No results found for: CHOL, HDL, LDLCALC, LDLDIRECT, TRIG, CHOLHDL Lab Results  Component Value  Date   WBC 8.3 09/29/2019   HGB 13.4 09/29/2019   HCT 39.9 09/29/2019   MCV 91 09/29/2019   PLT 205 09/29/2019   No results found for: IRON, TIBC, FERRITIN  Attestation Statements:   Reviewed by clinician on day of visit: allergies, medications, problem list, medical history, surgical history, family history, social history, and previous encounter notes.  Time spent on visit including pre-visit chart review and post-visit charting and care  was 15 minutes.   I, Marianna Payment, am acting as transcriptionist for Alyssa Likes, MD   I have reviewed the above documentation for accuracy and completeness, and I agree with the above. - Katherina Mires, MD

## 2019-12-02 ENCOUNTER — Other Ambulatory Visit (INDEPENDENT_AMBULATORY_CARE_PROVIDER_SITE_OTHER): Payer: Self-pay | Admitting: Family Medicine

## 2019-12-02 DIAGNOSIS — E559 Vitamin D deficiency, unspecified: Secondary | ICD-10-CM

## 2019-12-09 ENCOUNTER — Ambulatory Visit (INDEPENDENT_AMBULATORY_CARE_PROVIDER_SITE_OTHER): Payer: BC Managed Care – PPO | Admitting: Family Medicine

## 2019-12-09 ENCOUNTER — Encounter (INDEPENDENT_AMBULATORY_CARE_PROVIDER_SITE_OTHER): Payer: Self-pay | Admitting: Family Medicine

## 2019-12-09 ENCOUNTER — Other Ambulatory Visit: Payer: Self-pay

## 2019-12-09 VITALS — BP 149/75 | HR 69 | Temp 98.0°F | Ht 66.0 in | Wt 213.0 lb

## 2019-12-09 DIAGNOSIS — Z6834 Body mass index (BMI) 34.0-34.9, adult: Secondary | ICD-10-CM | POA: Diagnosis not present

## 2019-12-09 DIAGNOSIS — E038 Other specified hypothyroidism: Secondary | ICD-10-CM | POA: Diagnosis not present

## 2019-12-09 DIAGNOSIS — E559 Vitamin D deficiency, unspecified: Secondary | ICD-10-CM | POA: Diagnosis not present

## 2019-12-09 DIAGNOSIS — E669 Obesity, unspecified: Secondary | ICD-10-CM | POA: Diagnosis not present

## 2019-12-09 DIAGNOSIS — Z9189 Other specified personal risk factors, not elsewhere classified: Secondary | ICD-10-CM | POA: Diagnosis not present

## 2019-12-09 MED ORDER — WEGOVY 0.5 MG/0.5ML ~~LOC~~ SOAJ: 0.5000 mg | SUBCUTANEOUS | 0 refills | Status: DC

## 2019-12-09 MED ORDER — VITAMIN D (ERGOCALCIFEROL) 1.25 MG (50000 UNIT) PO CAPS: 50000.0000 [IU] | ORAL_CAPSULE | ORAL | 0 refills | Status: DC

## 2019-12-10 NOTE — Progress Notes (Signed)
Chief Complaint:   OBESITY Alyssa Blair is here to discuss her progress with her obesity treatment plan along with follow-up of her obesity related diagnoses. Alyssa Blair is on the Category 3 Plan and states she is following her eating plan approximately 90% of the time. Alyssa Blair states she is walking 5,000 steps 3-4 times per week.  Today's visit was #: 5 Starting weight: 222 lbs Starting date: 09/29/2019 Today's weight: 213 lbs Today's date: 12/09/2019 Total lbs lost to date: 9 Total lbs lost since last in-office visit: 1  Interim History: Alyssa Blair thought she was doing well following the meal plan. She was able to zip up her pants and pull them off without difficulty. She hasn't been walking as much. She denies hunger, but notes minimal cravings.  Subjective:   1. Vitamin D deficiency Alyssa Blair denies nausea, vomiting, or muscle weakness, but notes fatigue. She is on prescription Vit D, and last Vit D level was 37.3.  2. Other specified hypothyroidism Alyssa Blair is on levothyroxine 112 mcg. She denies cold/heat intolerance or palpitations.  3. At risk for osteoporosis Alyssa Blair is at higher risk of osteopenia and osteoporosis due to Vitamin D deficiency.   Assessment/Plan:   1. Vitamin D deficiency Low Vitamin D level contributes to fatigue and are associated with obesity, breast, and colon cancer. We will refill prescription Vitamin D for 1 month. Alyssa Blair will follow-up for routine testing of Vitamin D, at least 2-3 times per year to avoid over-replacement.  - Vitamin D, Ergocalciferol, (DRISDOL) 1.25 MG (50000 UNIT) CAPS capsule; Take 1 capsule (50,000 Units total) by mouth every 7 (seven) days.  Dispense: 4 capsule; Refill: 0  2. Other specified hypothyroidism Patient with long-standing hypothyroidism, and Alyssa Blair will continue levothyroxine, no refill needed. She appears euthyroid. Orders and follow up as documented in patient record.  Counseling . Good thyroid control is important for overall health.  Supratherapeutic thyroid levels are dangerous and will not improve weight loss results. . The correct way to take levothyroxine is fasting, with water, separated by at least 30 minutes from breakfast, and separated by more than 4 hours from calcium, iron, multivitamins, acid reflux medications (PPIs).   3. At risk for osteoporosis Alyssa Blair was given approximately 15 minutes of osteoporosis prevention counseling today. Alyssa Blair is at risk for osteopenia and osteoporosis due to her Vitamin D deficiency. She was encouraged to take her Vitamin D and follow her higher calcium diet and increase strengthening exercise to help strengthen her bones and decrease her risk of osteopenia and osteoporosis.  Repetitive spaced learning was employed today to elicit superior memory formation and behavioral change.  4. Class 1 obesity with serious comorbidity and body mass index (BMI) of 34.0 to 34.9 in adult, unspecified obesity type Alyssa Blair is currently in the action stage of change. As such, her goal is to continue with weight loss efforts. She has agreed to the Category 3 Plan.   We discussed various medication options to help Alyssa Blair with her weight loss efforts and we both agreed to continue Alyssa Blair, and we will refill for 1 month.  - Semaglutide-Weight Management (WEGOVY) 0.5 MG/0.5ML SOAJ; Inject 0.5 mg into the skin once a week.  Dispense: 2 mL; Refill: 0  Exercise goals: All adults should avoid inactivity. Some physical activity is better than none, and adults who participate in any amount of physical activity gain some health benefits.  Behavioral modification strategies: increasing lean protein intake, meal planning and cooking strategies and keeping healthy foods in the home.  Alyssa Blair has agreed to follow-up with our clinic in 3 weeks. She was informed of the importance of frequent follow-up visits to maximize her success with intensive lifestyle modifications for her multiple health conditions.   Objective:   Blood  pressure (!) 149/75, pulse 69, temperature 98 F (36.7 C), temperature source Oral, height 5\' 6"  (1.676 m), weight 213 lb (96.6 kg), SpO2 96 %. Body mass index is 34.38 kg/m.  General: Cooperative, alert, well developed, in no acute distress. HEENT: Conjunctivae and lids unremarkable. Cardiovascular: Regular rhythm.  Lungs: Normal work of breathing. Neurologic: No focal deficits.   Lab Results  Component Value Date   CREATININE 0.79 09/29/2019   BUN 16 09/29/2019   NA 143 09/29/2019   K 4.7 09/29/2019   CL 103 09/29/2019   CO2 26 09/29/2019   Lab Results  Component Value Date   ALT 92 (H) 09/29/2019   AST 83 (H) 09/29/2019   ALKPHOS 163 (H) 09/29/2019   BILITOT 0.3 09/29/2019   No results found for: HGBA1C Lab Results  Component Value Date   INSULIN 15.4 09/29/2019   Lab Results  Component Value Date   TSH 6.235  06/18/2008   No results found for: CHOL, HDL, LDLCALC, LDLDIRECT, TRIG, CHOLHDL Lab Results  Component Value Date   WBC 8.3 09/29/2019   HGB 13.4 09/29/2019   HCT 39.9 09/29/2019   MCV 91 09/29/2019   PLT 205 09/29/2019   No results found for: IRON, TIBC, FERRITIN  Attestation Statements:   Reviewed by clinician on day of visit: allergies, medications, problem list, medical history, surgical history, family history, social history, and previous encounter notes.   I, 10/01/2019, am acting as transcriptionist for Burt Knack, MD. I have reviewed the above documentation for accuracy and completeness, and I agree with the above. - Reuben Likes, MD

## 2019-12-31 ENCOUNTER — Other Ambulatory Visit: Payer: Self-pay

## 2019-12-31 ENCOUNTER — Ambulatory Visit (INDEPENDENT_AMBULATORY_CARE_PROVIDER_SITE_OTHER): Payer: BC Managed Care – PPO | Admitting: Family Medicine

## 2019-12-31 ENCOUNTER — Encounter (INDEPENDENT_AMBULATORY_CARE_PROVIDER_SITE_OTHER): Payer: Self-pay | Admitting: Family Medicine

## 2019-12-31 VITALS — BP 152/75 | HR 66 | Ht 66.0 in | Wt 209.0 lb

## 2019-12-31 DIAGNOSIS — I10 Essential (primary) hypertension: Secondary | ICD-10-CM

## 2019-12-31 DIAGNOSIS — Z9189 Other specified personal risk factors, not elsewhere classified: Secondary | ICD-10-CM | POA: Diagnosis not present

## 2019-12-31 DIAGNOSIS — Z6833 Body mass index (BMI) 33.0-33.9, adult: Secondary | ICD-10-CM

## 2019-12-31 DIAGNOSIS — E559 Vitamin D deficiency, unspecified: Secondary | ICD-10-CM

## 2019-12-31 DIAGNOSIS — E669 Obesity, unspecified: Secondary | ICD-10-CM | POA: Diagnosis not present

## 2019-12-31 MED ORDER — VITAMIN D (ERGOCALCIFEROL) 1.25 MG (50000 UNIT) PO CAPS: 50000.0000 [IU] | ORAL_CAPSULE | ORAL | 0 refills | Status: DC

## 2019-12-31 MED ORDER — WEGOVY 1 MG/0.5ML ~~LOC~~ SOAJ: 1.0000 mg | SUBCUTANEOUS | 0 refills | Status: DC

## 2019-12-31 NOTE — Progress Notes (Signed)
Chief Complaint:   OBESITY Alyssa Blair is here to discuss her progress with her obesity treatment plan along with follow-up of her obesity related diagnoses. Alyssa Blair is on the Category 3 Plan and states she is following her eating plan approximately 80-85% of the time. Alyssa Blair states she is walking 5,000 steps per day 7 times per week.  Today's visit was #: 6 Starting weight: 222 lbs Starting date: 09/29/2019 Today's weight: 209 lbs Today's date: 12/31/2019 Total lbs lost to date: 13 lbs Total lbs lost since last in-office visit: 4 lbs  Interim History: Alyssa Blair is shocked by her weight loss.  She says she ate Thanksgiving dinner twice.  She has been following Category 3 most of the time (occasionally going down to Category 2).  Doing Smart Ones meals occasionally.  Very infrequently hungry.  She has some church and family activities in the next few weeks.  Subjective:   1. Vitamin D deficiency Alyssa Blair's Vitamin D level was 37.3 on 09/29/2019. She is currently taking prescription vitamin D 50,000 IU each week. She denies nausea, vomiting or muscle weakness.  She endorses fatigue.  2. Essential hypertension Blood pressure is elevated today.  Denies chest pain, chest pressure, headache.   BP Readings from Last 3 Encounters:  12/31/19 (!) 152/75  12/09/19 (!) 149/75  11/18/19 120/74   3. At risk for nausea Alyssa Blair is at increased risk for nausea due to increase in Alyssa Blair dose.  Assessment/Plan:   1. Vitamin D deficiency Low Vitamin D level contributes to fatigue and are associated with obesity, breast, and colon cancer. She agrees to continue to take prescription Vitamin D @50 ,000 IU every week and will follow-up for routine testing of Vitamin D, at least 2-3 times per year to avoid over-replacement.  -Refill Vitamin D, Ergocalciferol, (DRISDOL) 1.25 MG (50000 UNIT) CAPS capsule; Take 1 capsule (50,000 Units total) by mouth every 7 (seven) days.  Dispense: 4 capsule; Refill: 0  2. Essential  hypertension Re-evaluate at next appointment.  If still elevated, will restart medications.  3. At risk for nausea Alyssa Blair was given approximately 15 minutes of nausea prevention counseling today. Alyssa Blair is at risk for nausea due to her new or current medication. She was encouraged to titrate her medication slowly, make sure to stay hydrated, eat smaller portions throughout the day, and avoid high fat meals.   4. Class 1 obesity with serious comorbidity and body mass index (BMI) of 33.0 to 33.9 in adult, unspecified obesity type  -Increase Semaglutide-Weight Management (WEGOVY) 1 MG/0.5ML SOAJ; Inject 1 mg into the skin once a week.  Dispense: 2 mL; Refill: 0  Alyssa Blair is currently in the action stage of change. As such, her goal is to continue with weight loss efforts. She has agreed to the Category 3 Plan.   Exercise goals: As is.  Behavioral modification strategies: increasing lean protein intake, meal planning and cooking strategies, keeping healthy foods in the home and planning for success.  Alyssa Blair has agreed to follow-up with our clinic in 4 weeks. She was informed of the importance of frequent follow-up visits to maximize her success with intensive lifestyle modifications for her multiple health conditions.   Objective:   Blood pressure (!) 152/75, pulse 66, height 5\' 6"  (1.676 m), weight 209 lb (94.8 kg), SpO2 96 %. Body mass index is 33.73 kg/m.  General: Cooperative, alert, well developed, in no acute distress. HEENT: Conjunctivae and lids unremarkable. Cardiovascular: Regular rhythm.  Lungs: Normal work of breathing. Neurologic: No  focal deficits.   Lab Results  Component Value Date   CREATININE 0.79 09/29/2019   BUN 16 09/29/2019   NA 143 09/29/2019   K 4.7 09/29/2019   CL 103 09/29/2019   CO2 26 09/29/2019   Lab Results  Component Value Date   ALT 92 (H) 09/29/2019   AST 83 (H) 09/29/2019   ALKPHOS 163 (H) 09/29/2019   BILITOT 0.3 09/29/2019   Lab Results   Component Value Date   INSULIN 15.4 09/29/2019   Lab Results  Component Value Date   TSH 6.235 Test methodology is 3rd generation TSH (H) 06/18/2008   Lab Results  Component Value Date   WBC 8.3 09/29/2019   HGB 13.4 09/29/2019   HCT 39.9 09/29/2019   MCV 91 09/29/2019   PLT 205 09/29/2019   Attestation Statements:   Reviewed by clinician on day of visit: allergies, medications, problem list, medical history, surgical history, family history, social history, and previous encounter notes.  I, Insurance claims handler, CMA, am acting as transcriptionist for Reuben Likes, MD. I have reviewed the above documentation for accuracy and completeness, and I agree with the above. - Katherina Mires, MD

## 2020-01-06 DIAGNOSIS — M25511 Pain in right shoulder: Secondary | ICD-10-CM | POA: Diagnosis not present

## 2020-01-08 DIAGNOSIS — M25511 Pain in right shoulder: Secondary | ICD-10-CM | POA: Diagnosis not present

## 2020-02-04 ENCOUNTER — Encounter (INDEPENDENT_AMBULATORY_CARE_PROVIDER_SITE_OTHER): Payer: Self-pay | Admitting: Family Medicine

## 2020-02-04 ENCOUNTER — Other Ambulatory Visit: Payer: Self-pay

## 2020-02-04 ENCOUNTER — Ambulatory Visit (INDEPENDENT_AMBULATORY_CARE_PROVIDER_SITE_OTHER): Payer: BC Managed Care – PPO | Admitting: Family Medicine

## 2020-02-04 VITALS — BP 139/75 | HR 75 | Temp 97.9°F | Ht 66.0 in | Wt 209.0 lb

## 2020-02-04 DIAGNOSIS — E669 Obesity, unspecified: Secondary | ICD-10-CM | POA: Diagnosis not present

## 2020-02-04 DIAGNOSIS — Z9189 Other specified personal risk factors, not elsewhere classified: Secondary | ICD-10-CM

## 2020-02-04 DIAGNOSIS — Z6833 Body mass index (BMI) 33.0-33.9, adult: Secondary | ICD-10-CM

## 2020-02-04 DIAGNOSIS — F3289 Other specified depressive episodes: Secondary | ICD-10-CM | POA: Diagnosis not present

## 2020-02-04 DIAGNOSIS — E559 Vitamin D deficiency, unspecified: Secondary | ICD-10-CM | POA: Diagnosis not present

## 2020-02-04 MED ORDER — WEGOVY 1 MG/0.5ML ~~LOC~~ SOAJ: 1.0000 mg | SUBCUTANEOUS | 0 refills | Status: DC

## 2020-02-04 MED ORDER — VITAMIN D (ERGOCALCIFEROL) 1.25 MG (50000 UNIT) PO CAPS: 50000.0000 [IU] | ORAL_CAPSULE | ORAL | 0 refills | Status: DC

## 2020-02-05 NOTE — Progress Notes (Signed)
Chief Complaint:   OBESITY Alyssa Blair is here to discuss her progress with her obesity treatment plan along with follow-up of her obesity related diagnoses. Alyssa Blair is on the Category 3 Plan and states she is following her eating plan approximately 65% of the time. Alyssa Blair states she is walking 60 minutes 2-3 times per week.  Today's visit was #: 7 Starting weight: 222 lbs Starting date: 09/29/2019 Today's weight: 209 lbs Today's date: 02/04/2020 Total lbs lost to date: 13 lbs Total lbs lost since last in-office visit: 0  Interim History: Alyssa Blair returns after the holidays. She has some depressing issues currently. She is currently awaiting surgery for torn rotator cuff. Alyssa Blair didn't necessarily follow meal plan over holidays.  Subjective:   1. Vitamin D deficiency Alyssa Blair's Vitamin D level was 37.3 on 09/29/2019. She is currently taking prescription vitamin D 50,000 IU each week. She denies nausea, vomiting or muscle weakness. She endorses fatigue.  2. Other depression Alyssa Blair is struggling with emotional eating and using food for comfort to the extent that it is negatively impacting her health. She has been working on behavior modification techniques to help reduce her emotional eating and has been somewhat successful. She shows no sign of suicidal or homicidal ideations. Alyssa Blair is on Effexor 75 mg twice daily. She has significant family life and health related stress.   3. At risk for depression Alyssa Blair is at elevated risk of depression due to one or more of the following: family history, significant life stressors, medical conditions and/or poor nutrition. Alyssa Blair has family and work stress.    Assessment/Plan:   1. Vitamin D deficiency Will refill Vit D 50,000 IU every week # 4. Refill 0.  - Vitamin D, Ergocalciferol, (DRISDOL) 1.25 MG (50000 UNIT) CAPS capsule; Take 1 capsule (50,000 Units total) by mouth every 7 (seven) days.  Dispense: 4 capsule; Refill: 0  2. Other depression Alyssa Blair will continue  Effexor. No change in dose. She will follow up at next appointment.  3. At risk for depression Alyssa Blair was given approximately 15 minutes of depression risk counseling today. We discussed the importance of a healthy work life balance, a healthy relationship with food and a good support system.  Repetitive spaced learning was employed today to elicit superior memory formation and behavioral change.  4. Class 1 obesity with serious comorbidity and body mass index (BMI) of 33.0 to 33.9 in adult, unspecified obesity type  Alyssa Blair is currently in the action stage of change. As such, her goal is to continue with weight loss efforts. She has agreed to the Category 3 Plan.   - Semaglutide-Weight Management (WEGOVY) 1 MG/0.5ML SOAJ; Inject 1 mg into the skin once a week.  Dispense: 2 mL; Refill: 0  Exercise goals: Some exercise.  Behavioral modification strategies: increasing lean protein intake, meal planning and cooking strategies, keeping healthy foods in the home and planning for success.  Alyssa Blair has agreed to follow-up with our clinic in 2 weeks. She was informed of the importance of frequent follow-up visits to maximize her success with intensive lifestyle modifications for her multiple health conditions.    Objective:   Blood pressure 139/75, pulse 75, temperature 97.9 F (36.6 C), temperature source Oral, height 5\' 6"  (1.676 m), weight 209 lb (94.8 kg), SpO2 96 %. Body mass index is 33.73 kg/m.  General: Cooperative, alert, well developed, in no acute distress. HEENT: Conjunctivae and lids unremarkable. Cardiovascular: Regular rhythm.  Lungs: Normal work of breathing. Neurologic: No focal deficits.  Lab Results  Component Value Date   CREATININE 0.79 09/29/2019   BUN 16 09/29/2019   NA 143 09/29/2019   K 4.7 09/29/2019   CL 103 09/29/2019   CO2 26 09/29/2019   Lab Results  Component Value Date   ALT 92 (H) 09/29/2019   AST 83 (H) 09/29/2019   ALKPHOS 163 (H) 09/29/2019    BILITOT 0.3 09/29/2019   No results found for: HGBA1C Lab Results  Component Value Date   INSULIN 15.4 09/29/2019   Lab Results  Component Value Date   TSH 6.235  06/18/2008   No results found for: CHOL, HDL, LDLCALC, LDLDIRECT, TRIG, CHOLHDL Lab Results  Component Value Date   WBC 8.3 09/29/2019   HGB 13.4 09/29/2019   HCT 39.9 09/29/2019   MCV 91 09/29/2019   PLT 205 09/29/2019   No results found for: IRON, TIBC, FERRITIN  Obesity Behavioral Intervention:   Approximately 15 minutes were spent on the discussion below.  ASK: We discussed the diagnosis of obesity with Alyssa Blair today and Alyssa Blair agreed to give [place] permission to discuss obesity behavioral modification therapy today.  ASSESS: Alyssa Blair has the diagnosis of obesity and her BMI today is 33.8. Alyssa Blair is in the action stage of change.   ADVISE: Alyssa Blair was educated on the multiple health risks of obesity as well as the benefit of weight loss to improve her health. She was advised of the need for long term treatment and the importance of lifestyle modifications to improve her current health and to decrease her risk of future health problems.  AGREE: Multiple dietary modification options and treatment options were discussed and Alyssa Blair agreed to follow the recommendations documented in the above note.  ARRANGE: Alyssa Blair was educated on the importance of frequent visits to treat obesity as outlined per CMS and USPSTF guidelines and agreed to schedule her next follow up appointment today.  Attestation Statements:   Reviewed by clinician on day of visit: allergies, medications, problem list, medical history, surgical history, family history, social history, and previous encounter notes.   I, Delorse Limber, am acting as transcriptionist for Reuben Likes, MD.  I have reviewed the above documentation for accuracy and completeness, and I agree with the above. - Katherina Mires, MD

## 2020-02-13 ENCOUNTER — Encounter (INDEPENDENT_AMBULATORY_CARE_PROVIDER_SITE_OTHER): Payer: Self-pay

## 2020-02-19 ENCOUNTER — Ambulatory Visit (INDEPENDENT_AMBULATORY_CARE_PROVIDER_SITE_OTHER): Payer: BC Managed Care – PPO | Admitting: Family Medicine

## 2020-02-19 DIAGNOSIS — M25511 Pain in right shoulder: Secondary | ICD-10-CM | POA: Diagnosis not present

## 2020-02-24 ENCOUNTER — Other Ambulatory Visit: Payer: Self-pay

## 2020-02-24 ENCOUNTER — Ambulatory Visit (INDEPENDENT_AMBULATORY_CARE_PROVIDER_SITE_OTHER): Payer: BC Managed Care – PPO | Admitting: Family Medicine

## 2020-02-24 ENCOUNTER — Encounter (INDEPENDENT_AMBULATORY_CARE_PROVIDER_SITE_OTHER): Payer: Self-pay | Admitting: Family Medicine

## 2020-02-24 VITALS — BP 142/80 | HR 65 | Temp 97.4°F | Ht 66.0 in | Wt 206.0 lb

## 2020-02-24 DIAGNOSIS — E669 Obesity, unspecified: Secondary | ICD-10-CM | POA: Diagnosis not present

## 2020-02-24 DIAGNOSIS — Z6833 Body mass index (BMI) 33.0-33.9, adult: Secondary | ICD-10-CM

## 2020-02-24 DIAGNOSIS — E559 Vitamin D deficiency, unspecified: Secondary | ICD-10-CM | POA: Diagnosis not present

## 2020-02-24 DIAGNOSIS — F3289 Other specified depressive episodes: Secondary | ICD-10-CM | POA: Diagnosis not present

## 2020-02-24 DIAGNOSIS — Z9189 Other specified personal risk factors, not elsewhere classified: Secondary | ICD-10-CM | POA: Diagnosis not present

## 2020-02-24 MED ORDER — WEGOVY 1.7 MG/0.75ML ~~LOC~~ SOAJ: 1.7000 mg | SUBCUTANEOUS | 0 refills | Status: DC

## 2020-02-25 NOTE — Progress Notes (Signed)
Chief Complaint:   OBESITY Alyssa Blair is here to discuss her progress with her obesity treatment plan along with follow-up of her obesity related diagnoses. Alyssa Blair is on the Category 2 Plan and the Category 3 Plan and states she is following her eating plan approximately 90% of the time. Alyssa Blair states she is walking 30 minutes 7 times per week.  Today's visit was #: 8 Starting weight: 222 lbs Starting date: 09/29/2019 Today's weight: 206 lbs Today's date: 02/24/2020 Total lbs lost to date: 13 Total lbs lost since last in-office visit: 3 lbs  Interim History: Last few weeks have been more on plan than previous weeks. Alyssa Blair is not getting shoulder surgery for the time being. Things still not going well in terms of her son and his family. Work is fairly intense currently. Alyssa Blair has done some emotional eating but is trying to choose a more nutrious options. Alyssa Blair want to stay as committed to meal plan as she can.  Subjective:   1. Vitamin D deficiency Alyssa Blair's last Vitamin d was 37.3. She is on prescription Vitamin D. Alyssa Blair denies nausea, vomiting, or muscle weakness, but notes fatigue.  2. Other depression Alyssa Blair is having some rumination concerning son and his family. She is on Effexor and denies suicidal or homicidal ideas.  3. At risk for side effect of medication Alyssa Blair is at risk of side effects from increase Wegovy to 1.7 mg from 1 mg.   Assessment/Plan:   1. Vitamin D deficiency Low Vitamin D level contributes to fatigue and are associated with obesity, breast, and colon cancer. Alyssa Blair agrees to continue to take prescription Vitamin D @50 ,000 IU every week and will follow-up for routine testing of Vitamin D, at least 2-3 times per year to avoid over-replacement. We will refill Vitamin D 50,000 unit weekly #4 no refill.  2. Other depression Behavior modification techniques were discussed today to help Alyssa Blair deal with her emotional/non-hunger eating behaviors.  Orders and follow up as documented in  patient record. Alyssa Blair will continue Effexor. No change in dose.  3. At risk for side effect of medication Alyssa Blair was given approximately 15 minutes of drug side effect counseling today.  We discussed side effect possibility and risk versus benefits. Alyssa Blair agreed to the medication and will contact this office if these side effects are intolerable.  Repetitive spaced learning was employed today to elicit superior memory formation and behavioral change. 4. Class 1 obesity with serious comorbidity and body mass index (BMI) of 33.0 to 33.9 in adult, unspecified obesity type   Alyssa Blair is currently in the action stage of change. As such, her goal is to continue with weight loss efforts. She has agreed to the Category 2 Plan and the Category 3 Plan.   - Semaglutide-Weight Management (WEGOVY) 1.7 MG/0.75ML SOAJ; Inject 1.7 mg into the skin once a week.  Dispense: 3 mL; Refill: 0  Exercise goals: No exercise has been prescribed at this time.  Behavioral modification strategies: increasing lean protein intake, meal planning and cooking strategies, keeping healthy foods in the home and planning for success.  Alyssa Blair has agreed to follow-up with our clinic in 2 weeks. She was informed of the importance of frequent follow-up visits to maximize her success with intensive lifestyle modifications for her multiple health conditions.     Objective:   Blood pressure (!) 142/80, pulse 65, temperature (!) 97.4 F (36.3 C), temperature source Oral, height 5\' 6"  (1.676 m), weight 206 lb (93.4 kg), SpO2 98 %. Body mass  index is 33.25 kg/m.  General: Cooperative, alert, well developed, in no acute distress. HEENT: Conjunctivae and lids unremarkable. Cardiovascular: Regular rhythm.  Lungs: Normal work of breathing. Neurologic: No focal deficits.   Lab Results  Component Value Date   CREATININE 0.79 09/29/2019   BUN 16 09/29/2019   NA 143 09/29/2019   K 4.7 09/29/2019   CL 103 09/29/2019   CO2 26 09/29/2019    Lab Results  Component Value Date   ALT 92 (H) 09/29/2019   AST 83 (H) 09/29/2019   ALKPHOS 163 (H) 09/29/2019   BILITOT 0.3 09/29/2019   No results found for: HGBA1C Lab Results  Component Value Date   INSULIN 15.4 09/29/2019   Lab Results  Component Value Date   TSH 6.235  06/18/2008   No results found for: CHOL, HDL, LDLCALC, LDLDIRECT, TRIG, CHOLHDL Lab Results  Component Value Date   WBC 8.3 09/29/2019   HGB 13.4 09/29/2019   HCT 39.9 09/29/2019   MCV 91 09/29/2019   PLT 205 09/29/2019   No results found for: IRON, TIBC, FERRITIN     Attestation Statements:   Reviewed by clinician on day of visit: allergies, medications, problem list, medical history, surgical history, family history, social history, and previous encounter notes.   I, Delorse Limber, am acting as transcriptionist for Reuben Likes, MD.  I have reviewed the above documentation for accuracy and completeness, and I agree with the above. - Katherina Mires, MD

## 2020-02-26 MED ORDER — VITAMIN D (ERGOCALCIFEROL) 1.25 MG (50000 UNIT) PO CAPS: 50000.0000 [IU] | ORAL_CAPSULE | ORAL | 0 refills | Status: DC

## 2020-03-09 ENCOUNTER — Other Ambulatory Visit: Payer: Self-pay

## 2020-03-09 ENCOUNTER — Encounter (INDEPENDENT_AMBULATORY_CARE_PROVIDER_SITE_OTHER): Payer: Self-pay | Admitting: Family Medicine

## 2020-03-09 ENCOUNTER — Ambulatory Visit (INDEPENDENT_AMBULATORY_CARE_PROVIDER_SITE_OTHER): Payer: BC Managed Care – PPO | Admitting: Family Medicine

## 2020-03-09 VITALS — BP 125/79 | Temp 97.6°F | Ht 66.0 in | Wt 203.0 lb

## 2020-03-09 DIAGNOSIS — E8881 Metabolic syndrome: Secondary | ICD-10-CM | POA: Diagnosis not present

## 2020-03-09 DIAGNOSIS — E559 Vitamin D deficiency, unspecified: Secondary | ICD-10-CM | POA: Diagnosis not present

## 2020-03-09 DIAGNOSIS — E88819 Insulin resistance, unspecified: Secondary | ICD-10-CM

## 2020-03-09 DIAGNOSIS — E039 Hypothyroidism, unspecified: Secondary | ICD-10-CM | POA: Diagnosis not present

## 2020-03-09 DIAGNOSIS — E669 Obesity, unspecified: Secondary | ICD-10-CM

## 2020-03-09 DIAGNOSIS — Z9189 Other specified personal risk factors, not elsewhere classified: Secondary | ICD-10-CM | POA: Diagnosis not present

## 2020-03-09 DIAGNOSIS — Z6832 Body mass index (BMI) 32.0-32.9, adult: Secondary | ICD-10-CM

## 2020-03-09 DIAGNOSIS — E038 Other specified hypothyroidism: Secondary | ICD-10-CM

## 2020-03-09 DIAGNOSIS — R7401 Elevation of levels of liver transaminase levels: Secondary | ICD-10-CM

## 2020-03-10 LAB — VITAMIN D 25 HYDROXY (VIT D DEFICIENCY, FRACTURES): Vit D, 25-Hydroxy: 60.8 ng/mL (ref 30.0–100.0)

## 2020-03-10 LAB — COMPREHENSIVE METABOLIC PANEL
ALT: 42 IU/L — ABNORMAL HIGH (ref 0–32)
AST: 31 IU/L (ref 0–40)
Albumin/Globulin Ratio: 1.7 (ref 1.2–2.2)
Albumin: 4.5 g/dL (ref 3.8–4.8)
Alkaline Phosphatase: 132 IU/L — ABNORMAL HIGH (ref 44–121)
BUN/Creatinine Ratio: 18 (ref 12–28)
BUN: 14 mg/dL (ref 8–27)
Bilirubin Total: 0.5 mg/dL (ref 0.0–1.2)
CO2: 23 mmol/L (ref 20–29)
Calcium: 9.5 mg/dL (ref 8.7–10.3)
Chloride: 103 mmol/L (ref 96–106)
Creatinine, Ser: 0.78 mg/dL (ref 0.57–1.00)
GFR calc Af Amer: 92 mL/min/{1.73_m2} (ref >59)
GFR calc non Af Amer: 80 mL/min/{1.73_m2} (ref >59)
Globulin, Total: 2.6 g/dL (ref 1.5–4.5)
Glucose: 91 mg/dL (ref 65–99)
Potassium: 4.4 mmol/L (ref 3.5–5.2)
Sodium: 141 mmol/L (ref 134–144)
Total Protein: 7.1 g/dL (ref 6.0–8.5)

## 2020-03-10 LAB — HEMOGLOBIN A1C
Est. average glucose Bld gHb Est-mCnc: 108 mg/dL
Hgb A1c MFr Bld: 5.4 % (ref 4.8–5.6)

## 2020-03-10 LAB — THYROID PANEL WITH TSH
Free Thyroxine Index: 4 (ref 1.2–4.9)
T3 Uptake Ratio: 32 % (ref 24–39)
T4, Total: 12.6 ug/dL — ABNORMAL HIGH (ref 4.5–12.0)
TSH: 0.783 u[IU]/mL (ref 0.450–4.500)

## 2020-03-10 LAB — INSULIN, RANDOM: INSULIN: 16.8 u[IU]/mL (ref 2.6–24.9)

## 2020-03-10 NOTE — Progress Notes (Signed)
Chief Complaint:   OBESITY Alyssa Blair is here to discuss her progress with her obesity treatment plan along with follow-up of her obesity related diagnoses. Alyssa Blair is on the Category 3 Plan and states she is following her eating plan approximately 85-90% of the time. Alyssa Blair states she is walking 3,000 steps 7 times per week.  Today's visit was #: 9 Starting weight: 222 lbs Starting date: 09/29/2019 Today's weight: 203 lbs Today's date: 03/09/2020 Total lbs lost to date: 16 lbs Total lbs lost since last in-office visit: 3 lbs  Interim History: Alyssa Blair is planning a bridal shower for her niece, who is getting married in June. She has been doing activities at home with her dog, as the weather has limited outdoor activity. She feels she is doing well on the meal plan and has gotten back to eating cottage cheese and yogurt.  Subjective:   1. Vitamin D deficiency Alyssa Blair's last Vit D level was 37.3. She is taking prescription Vit D.  2. Transaminitis Alyssa Blair's last liver function test was slightly elevated. There is no ultrasound on file.  3. Insulin resistance Alyssa Blair's last A1c was 5.6 and insulin level of 15.4. She is on GLP-1 therapy.  4. Other specified hypothyroidism Alyssa Blair's last thyroid panel was within normal limits. She is on Synthroid 112 mcg.  5. At risk for impaired function of liver Alyssa Blair is at risk for impaired function of liver due to likely diagnosis of fatty liver as evidenced by recent elevated liver enzymes.   Assessment/Plan:   1. Vitamin D deficiency Low Vitamin D level contributes to fatigue and are associated with obesity, breast, and colon cancer. She agrees to continue to take prescription Vitamin D @50 ,000 IU every week and will follow-up for routine testing of Vitamin D, at least 2-3 times per year to avoid over-replacement. Check labs today.  - VITAMIN D 25 Hydroxy (Vit-D Deficiency, Fractures)  2. Transaminitis Check CMP today.  - Comprehensive metabolic panel  3.  Insulin resistance [place] will continue to work on weight loss, exercise, and decreasing simple carbohydrates to help decrease the risk of diabetes. Alyssa Blair agreed to follow-up with Alyssa Blair as directed to closely monitor her progress. Check labs today.  - Hemoglobin A1c - Insulin, random  4. Other specified hypothyroidism Patient with long-standing hypothyroidism, on levothyroxine therapy. She appears euthyroid. Orders and follow up as documented in patient record. Continue Synthroid with no change in dose. Check labs today.  Counseling . Good thyroid control is important for overall health. Supratherapeutic thyroid levels are dangerous and will not improve weight loss results. . The correct way to take levothyroxine is fasting, with water, separated by at least 30 minutes from breakfast, and separated by more than 4 hours from calcium, iron, multivitamins, acid reflux medications (PPIs).   - Thyroid Panel With TSH  5. At risk for impaired function of liver Alyssa Blair was given approximately 15 minutes of counseling today regarding prevention of impaired liver function. Alyssa Blair was educated about her risk of developing NASH or even liver failure and advised that the only proven treatment for NAFLD was weight loss of at least 5-10% of body weight.   6. Class 1 obesity with serious comorbidity and body mass index (BMI) of 32.0 to 32.9 in adult, unspecified obesity type Alyssa Blair is currently in the action stage of change. As such, her goal is to continue with weight loss efforts. She has agreed to the Category 3 Plan.   Exercise goals: All adults should avoid inactivity. Some  physical activity is better than none, and adults who participate in any amount of physical activity gain some health benefits.  Behavioral modification strategies: increasing lean protein intake, meal planning and cooking strategies, keeping healthy foods in the home and planning for success.  Alyssa Blair has agreed to follow-up with our clinic in 2-3  weeks. She was informed of the importance of frequent follow-up visits to maximize her success with intensive lifestyle modifications for her multiple health conditions.   Alyssa Blair was informed we would discuss her lab results at her next visit unless there is a critical issue that needs to be addressed sooner. Alyssa Blair agreed to keep her next visit at the agreed upon time to discuss these results.  Objective:   Blood pressure 125/79, temperature 97.6 F (36.4 C), temperature source Oral, height 5\' 6"  (1.676 m), weight 203 lb (92.1 kg), SpO2 98 %. Body mass index is 32.77 kg/m.  General: Cooperative, alert, well developed, in no acute distress. HEENT: Conjunctivae and lids unremarkable. Cardiovascular: Regular rhythm.  Lungs: Normal work of breathing. Neurologic: No focal deficits.   Lab Results  Component Value Date   CREATININE 0.78 03/09/2020   BUN 14 03/09/2020   NA 141 03/09/2020   K 4.4 03/09/2020   CL 103 03/09/2020   CO2 23 03/09/2020   Lab Results  Component Value Date   ALT 42 (H) 03/09/2020   AST 31 03/09/2020   ALKPHOS 132 (H) 03/09/2020   BILITOT 0.5 03/09/2020   Lab Results  Component Value Date   HGBA1C 5.4 03/09/2020   Lab Results  Component Value Date   INSULIN 16.8 03/09/2020   INSULIN 15.4 09/29/2019   Lab Results  Component Value Date   TSH 0.783 03/09/2020   No results found for: CHOL, HDL, LDLCALC, LDLDIRECT, TRIG, CHOLHDL Lab Results  Component Value Date   WBC 8.3 09/29/2019   HGB 13.4 09/29/2019   HCT 39.9 09/29/2019   MCV 91 09/29/2019   PLT 205 09/29/2019   No results found for: IRON, TIBC, FERRITIN  Attestation Statements:   Reviewed by clinician on day of visit: allergies, medications, problem list, medical history, surgical history, family history, social history, and previous encounter notes.  10/01/2019, am acting as transcriptionist for Edmund Hilda, MD.   I have reviewed the above documentation for accuracy and  completeness, and I agree with the above. - Reuben Likes, MD

## 2020-03-29 ENCOUNTER — Telehealth (INDEPENDENT_AMBULATORY_CARE_PROVIDER_SITE_OTHER): Payer: BC Managed Care – PPO | Admitting: Family Medicine

## 2020-03-29 DIAGNOSIS — Z6833 Body mass index (BMI) 33.0-33.9, adult: Secondary | ICD-10-CM | POA: Diagnosis not present

## 2020-03-29 DIAGNOSIS — E559 Vitamin D deficiency, unspecified: Secondary | ICD-10-CM | POA: Diagnosis not present

## 2020-03-29 DIAGNOSIS — E669 Obesity, unspecified: Secondary | ICD-10-CM

## 2020-03-29 DIAGNOSIS — R7401 Elevation of levels of liver transaminase levels: Secondary | ICD-10-CM | POA: Diagnosis not present

## 2020-03-29 MED ORDER — WEGOVY 1.7 MG/0.75ML ~~LOC~~ SOAJ: 1.7000 mg | SUBCUTANEOUS | 0 refills | Status: DC

## 2020-03-29 MED ORDER — VITAMIN D (ERGOCALCIFEROL) 1.25 MG (50000 UNIT) PO CAPS: 50000.0000 [IU] | ORAL_CAPSULE | ORAL | 0 refills | Status: DC

## 2020-03-30 NOTE — Progress Notes (Unsigned)
TeleHealth Visit:  Due to the COVID-19 pandemic, this visit was completed with telemedicine (audio/video) technology to reduce patient and provider exposure as well as to preserve personal protective equipment.   Alyssa Blair has verbally consented to this TeleHealth visit. The patient is located at home, the provider is located at the [facility] office. The participants in this visit include the listed provider and patient. The visit was conducted today via video.  Chief Complaint: OBESITY Alyssa Blair is here to discuss her progress with her obesity treatment plan along with follow-up of her obesity related diagnoses. Alyssa Blair is on the Category 3 Plan and states she is following her eating plan approximately 90% of the time. Alyssa Blair states she is doing squats and other leg exercises 60 minutes 5 times per week.  Today's visit was #: 10 Starting weight: 222 lbs Starting date: 09/29/2019  Interim History: Alyssa Blair reports a weight of 203 lbs today. The last few weeks have been better. She bought a book for exercises for seniors. She is ding exercises 5 times a week. She reports occasional hunger but filling up quicker. She is doing protein bar or protein drink in between. She denies hunger that isn't satiated with protein bar or shake.  Subjective:   1. Vitamin D deficiency Alyssa Blair denies nausea, vomiting, and muscle weakness but notes fatigue. Pt is on prescription Vit D.  2. Transaminitis Alyssa Blair's last AST was 31 and ALT 42. This is improved from previous check.  Assessment/Plan:   1. Vitamin D deficiency Low Vitamin D level contributes to fatigue and are associated with obesity, breast, and colon cancer. She agrees to continue to take prescription Vitamin D @50 ,000 IU every week and will follow-up for routine testing of Vitamin D, at least 2-3 times per year to avoid over-replacement.  - Vitamin D, Ergocalciferol, (DRISDOL) 1.25 MG (50000 UNIT) CAPS capsule; Take 1 capsule (50,000 Units total)  by mouth every 7 (seven) days.  Dispense: 4 capsule; Refill: 0  2. Transaminitis Repeat labs in June 2022.  3. Class 1 obesity with serious comorbidity and body mass index (BMI) of 33.0 to 33.9 in adult, unspecified obesity type Alyssa Blair is currently in the action stage of change. As such, her goal is to continue with weight loss efforts. She has agreed to the Category 3 Plan.   We discussed various medication options to help Alyssa Blair with her weight loss efforts and we both agreed to continue [place], as per below. - Semaglutide-Weight Management (WEGOVY) 1.7 MG/0.75ML SOAJ; Inject 1.7 mg into the skin once a week.  Dispense: 3 mL; Refill: 0  Exercise goals: As is  Behavioral modification strategies: increasing lean protein intake, meal planning and cooking strategies, keeping healthy foods in the home and planning for success.  Alyssa Blair has agreed to follow-up with our clinic in 3 weeks. She was informed of the importance of frequent follow-up visits to maximize her success with intensive lifestyle modifications for her multiple health conditions.  Objective:   VITALS: Per patient if applicable, see vitals. GENERAL: Alert and in no acute distress. CARDIOPULMONARY: No increased WOB. Speaking in clear sentences.  PSYCH: Pleasant and cooperative. Speech normal rate and rhythm. Affect is appropriate. Insight and judgement are appropriate. Attention is focused, linear, and appropriate.  NEURO: Oriented as arrived to appointment on time with no prompting.   Lab Results  Component Value Date   CREATININE 0.78 03/09/2020   BUN 14 03/09/2020   NA 141 03/09/2020   K 4.4 03/09/2020  CL 103 03/09/2020   CO2 23 03/09/2020   Lab Results  Component Value Date   ALT 42 (H) 03/09/2020   AST 31 03/09/2020   ALKPHOS 132 (H) 03/09/2020   BILITOT 0.5 03/09/2020   Lab Results  Component Value Date   HGBA1C 5.4 03/09/2020   Lab Results  Component Value Date   INSULIN 16.8 03/09/2020   INSULIN 15.4  09/29/2019   Lab Results  Component Value Date   TSH 0.783 03/09/2020   No results found for: CHOL, HDL, LDLCALC, LDLDIRECT, TRIG, CHOLHDL Lab Results  Component Value Date   WBC 8.3 09/29/2019   HGB 13.4 09/29/2019   HCT 39.9 09/29/2019   MCV 91 09/29/2019   PLT 205 09/29/2019   No results found for: IRON, TIBC, FERRITIN  Attestation Statements:   Reviewed by clinician on day of visit: allergies, medications, problem list, medical history, surgical history, family history, social history, and previous encounter notes.  Edmund Hilda, am acting as transcriptionist for Reuben Likes, MD.   I have reviewed the above documentation for accuracy and completeness, and I agree with the above. - Katherina Mires, MD

## 2020-04-01 DIAGNOSIS — M25511 Pain in right shoulder: Secondary | ICD-10-CM | POA: Diagnosis not present

## 2020-04-07 ENCOUNTER — Encounter (INDEPENDENT_AMBULATORY_CARE_PROVIDER_SITE_OTHER): Payer: Self-pay

## 2020-04-07 DIAGNOSIS — M5430 Sciatica, unspecified side: Secondary | ICD-10-CM | POA: Insufficient documentation

## 2020-04-07 DIAGNOSIS — R12 Heartburn: Secondary | ICD-10-CM | POA: Insufficient documentation

## 2020-04-07 DIAGNOSIS — K5909 Other constipation: Secondary | ICD-10-CM | POA: Insufficient documentation

## 2020-04-07 DIAGNOSIS — E039 Hypothyroidism, unspecified: Secondary | ICD-10-CM | POA: Insufficient documentation

## 2020-04-07 DIAGNOSIS — K219 Gastro-esophageal reflux disease without esophagitis: Secondary | ICD-10-CM | POA: Insufficient documentation

## 2020-04-07 DIAGNOSIS — N951 Menopausal and female climacteric states: Secondary | ICD-10-CM | POA: Insufficient documentation

## 2020-04-07 DIAGNOSIS — R1310 Dysphagia, unspecified: Secondary | ICD-10-CM | POA: Insufficient documentation

## 2020-04-07 DIAGNOSIS — I10 Essential (primary) hypertension: Secondary | ICD-10-CM | POA: Insufficient documentation

## 2020-04-07 DIAGNOSIS — E7800 Pure hypercholesterolemia, unspecified: Secondary | ICD-10-CM | POA: Insufficient documentation

## 2020-04-07 DIAGNOSIS — G4700 Insomnia, unspecified: Secondary | ICD-10-CM | POA: Insufficient documentation

## 2020-04-07 DIAGNOSIS — J45909 Unspecified asthma, uncomplicated: Secondary | ICD-10-CM | POA: Insufficient documentation

## 2020-04-07 DIAGNOSIS — E669 Obesity, unspecified: Secondary | ICD-10-CM | POA: Insufficient documentation

## 2020-04-19 ENCOUNTER — Ambulatory Visit (INDEPENDENT_AMBULATORY_CARE_PROVIDER_SITE_OTHER): Payer: BC Managed Care – PPO | Admitting: Family Medicine

## 2020-04-19 ENCOUNTER — Other Ambulatory Visit: Payer: Self-pay

## 2020-04-19 ENCOUNTER — Encounter (INDEPENDENT_AMBULATORY_CARE_PROVIDER_SITE_OTHER): Payer: Self-pay | Admitting: Family Medicine

## 2020-04-19 VITALS — BP 124/76 | HR 64 | Temp 97.7°F | Ht 66.0 in | Wt 200.0 lb

## 2020-04-19 DIAGNOSIS — Z9189 Other specified personal risk factors, not elsewhere classified: Secondary | ICD-10-CM

## 2020-04-19 DIAGNOSIS — E559 Vitamin D deficiency, unspecified: Secondary | ICD-10-CM | POA: Diagnosis not present

## 2020-04-19 DIAGNOSIS — E8881 Metabolic syndrome: Secondary | ICD-10-CM | POA: Diagnosis not present

## 2020-04-19 DIAGNOSIS — Z6835 Body mass index (BMI) 35.0-35.9, adult: Secondary | ICD-10-CM

## 2020-04-19 DIAGNOSIS — E88819 Insulin resistance, unspecified: Secondary | ICD-10-CM

## 2020-04-19 MED ORDER — VITAMIN D (ERGOCALCIFEROL) 1.25 MG (50000 UNIT) PO CAPS: 50000.0000 [IU] | ORAL_CAPSULE | ORAL | 0 refills | Status: DC

## 2020-04-19 MED ORDER — WEGOVY 2.4 MG/0.75ML ~~LOC~~ SOAJ: 2.4000 mg | SUBCUTANEOUS | 0 refills | Status: DC

## 2020-04-21 NOTE — Progress Notes (Signed)
Chief Complaint:   OBESITY Alyssa Blair is here to discuss her progress with her obesity treatment plan along with follow-up of her obesity related diagnoses. Alyssa Blair is on the Category 3 Plan and states she is following her eating plan approximately 90-95% of the time. Alyssa Blair states she is walking 5-6,000 steps and exercising 30 minutes 7 times per week.  Today's visit was #: 11 Starting weight: 222 lbs Starting date: 09/29/2019 Today's weight: 200 lbs Today's date: 04/19/2020 Total lbs lost to date: 22 lbs Total lbs lost since last in-office visit: 3 lbs  Interim History: Alyssa Blair is doing reasonably well on Category 3. She is occasionally feeling bored with meal plan. She occasionally does want to go off the plan but isn't sure what she wants. She is experiencing hunger, and if she overeats or has a large meal, she will experience emesis.   Subjective:   1. Vitamin D deficiency Alyssa Blair denies nausea, vomiting, and muscle weakness but notes fatigue. She is on prescription Vit D.  2. Insulin resistance Alyssa Blair reports minimal carb cravings. She is on GLP-1.  3. At risk for side effect of medication Alyssa Blair is at risk for side effect of medication due to increased dose of Wegovy.  Assessment/Plan:   1. Vitamin D deficiency Low Vitamin D level contributes to fatigue and are associated with obesity, breast, and colon cancer. She agrees to continue to take prescription Vitamin D @50 ,000 IU every 14 days and will follow-up for routine testing of Vitamin D, at least 2-3 times per year to avoid over-replacement.  - Vitamin D, Ergocalciferol, (DRISDOL) 1.25 MG (50000 UNIT) CAPS capsule; Take 1 capsule (50,000 Units total) by mouth every 14 (fourteen) days.  Dispense: 4 capsule; Refill: 0  2. Insulin resistance Alyssa Blair will continue to work on weight loss, exercise, and decreasing simple carbohydrates to help decrease the risk of diabetes. Alyssa Blair agreed to follow-up with Alyssa Blair as directed to closely monitor her  progress. Continue GLP-1.  3. At risk for side effect of medication Alyssa Blair was given approximately 15 minutes of drug side effect counseling today.  We discussed side effect possibility and risk versus benefits. Alyssa Blair agreed to the medication and will contact this office if these side effects are intolerable.  Repetitive spaced learning was employed today to elicit superior memory formation and behavioral change.  4. Class 2 severe obesity with serious comorbidity and body mass index (BMI) of 35.0 to 35.9 in adult, unspecified obesity type (HCC) Alyssa Blair is currently in the action stage of change. As such, her goal is to continue with weight loss efforts. She has agreed to the Category 3 Plan.   We discussed various medication options to help Alyssa Blair with her weight loss efforts and we both agreed to increase Wegovy to 2.4 mg, as per below. - Semaglutide-Weight Management (WEGOVY) 2.4 MG/0.75ML SOAJ; Inject 2.4 mg into the skin once a week.  Dispense: 3 mL; Refill: 0  Exercise goals: All adults should avoid inactivity. Some physical activity is better than none, and adults who participate in any amount of physical activity gain some health benefits.  Behavioral modification strategies: increasing lean protein intake, meal planning and cooking strategies, keeping healthy foods in the home and planning for success.  Alyssa Blair has agreed to follow-up with our clinic in 2 weeks. She was informed of the importance of frequent follow-up visits to maximize her success with intensive lifestyle modifications for her multiple health conditions.   Objective:   Blood pressure 124/76, pulse 64, temperature  97.7 F (36.5 C), temperature source Oral, height 5\' 6"  (1.676 m), weight 200 lb (90.7 kg), SpO2 95 %. Body mass index is 32.28 kg/m.  General: Cooperative, alert, well developed, in no acute distress. HEENT: Conjunctivae and lids unremarkable. Cardiovascular: Regular rhythm.  Lungs: Normal work of  breathing. Neurologic: No focal deficits.   Lab Results  Component Value Date   CREATININE 0.78 03/09/2020   BUN 14 03/09/2020   NA 141 03/09/2020   K 4.4 03/09/2020   CL 103 03/09/2020   CO2 23 03/09/2020   Lab Results  Component Value Date   ALT 42 (H) 03/09/2020   AST 31 03/09/2020   ALKPHOS 132 (H) 03/09/2020   BILITOT 0.5 03/09/2020   Lab Results  Component Value Date   HGBA1C 5.4 03/09/2020   Lab Results  Component Value Date   INSULIN 16.8 03/09/2020   INSULIN 15.4 09/29/2019   Lab Results  Component Value Date   TSH 0.783 03/09/2020   No results found for: CHOL, HDL, LDLCALC, LDLDIRECT, TRIG, CHOLHDL Lab Results  Component Value Date   WBC 8.3 09/29/2019   HGB 13.4 09/29/2019   HCT 39.9 09/29/2019   MCV 91 09/29/2019   PLT 205 09/29/2019    Attestation Statements:   Reviewed by clinician on day of visit: allergies, medications, problem list, medical history, surgical history, family history, social history, and previous encounter notes.  10/01/2019, am acting as transcriptionist for Edmund Hilda, MD.   I have reviewed the above documentation for accuracy and completeness, and I agree with the above. - Alyssa Likes, MD

## 2020-05-06 ENCOUNTER — Other Ambulatory Visit: Payer: Self-pay

## 2020-05-06 ENCOUNTER — Ambulatory Visit (INDEPENDENT_AMBULATORY_CARE_PROVIDER_SITE_OTHER): Payer: BC Managed Care – PPO | Admitting: Family Medicine

## 2020-05-06 ENCOUNTER — Encounter (INDEPENDENT_AMBULATORY_CARE_PROVIDER_SITE_OTHER): Payer: Self-pay | Admitting: Family Medicine

## 2020-05-06 VITALS — BP 123/74 | HR 71 | Temp 98.1°F | Ht 66.0 in | Wt 197.0 lb

## 2020-05-06 DIAGNOSIS — Z9189 Other specified personal risk factors, not elsewhere classified: Secondary | ICD-10-CM

## 2020-05-06 DIAGNOSIS — E039 Hypothyroidism, unspecified: Secondary | ICD-10-CM

## 2020-05-06 DIAGNOSIS — E559 Vitamin D deficiency, unspecified: Secondary | ICD-10-CM

## 2020-05-06 DIAGNOSIS — Z6835 Body mass index (BMI) 35.0-35.9, adult: Secondary | ICD-10-CM

## 2020-05-06 MED ORDER — WEGOVY 2.4 MG/0.75ML ~~LOC~~ SOAJ: 2.4000 mg | SUBCUTANEOUS | 0 refills | Status: DC

## 2020-05-12 NOTE — Progress Notes (Signed)
Chief Complaint:   OBESITY Alyssa Blair is here to discuss her progress with her obesity treatment plan along with follow-up of her obesity related diagnoses. Alyssa Blair is on the Category 3 Plan and states she is following her eating plan approximately 85-90% of the time. Alyssa Blair states she is not currently exercising.  Today's visit was #: 12 Starting weight: 222 lbs Starting date: 09/29/2019 Today's weight: 197 lbs Today's date: 05/06/2020 Total lbs lost to date: 25 lbs Total lbs lost since last in-office visit: 3  Interim History: Alyssa Blair is planning on returning to the office April 26th for 2 days a week. She has been trying to get more protein in daily and is branching out in different prep strategies. She is going to [place] this weekend. Pt is feeling frustrated with abdominal/waist circumference.  Subjective:   1. Hypothyroidism, unspecified type Alyssa Blair's last TSH 0.783. She is on levothyroxine 112 mcg.  2. Vitamin D deficiency Alyssa Blair denies nausea, vomiting, and muscle weakness but notes fatigue. Pt is on prescription Vit D every 2 weeks.  3. At risk for side effect of medication Alyssa Blair is at risk for side effects of medication due to starting maximum dose of Wegovy.  Assessment/Plan:   1. Hypothyroidism, unspecified type Patient with long-standing hypothyroidism, on levothyroxine therapy. She appears euthyroid. Orders and follow up as documented in patient record. Check labs at next appt.  Counseling . Good thyroid control is important for overall health. Supratherapeutic thyroid levels are dangerous and will not improve weight loss results. . The correct way to take levothyroxine is fasting, with water, separated by at least 30 minutes from breakfast, and separated by more than 4 hours from calcium, iron, multivitamins, acid reflux medications (PPIs).   2. Vitamin D deficiency Low Vitamin D level contributes to fatigue and are associated with obesity, breast, and colon cancer. She  agrees to continue to take prescription Vitamin D @50 ,000 IU every 2 weeks and will follow-up for routine testing of Vitamin D, at least 2-3 times per year to avoid over-replacement.  3. At risk for side effect of medication Alyssa Blair was given approximately 15 minutes of drug side effect counseling today.  We discussed side effect possibility and risk versus benefits. Alyssa Blair agreed to the medication and will contact this office if these side effects are intolerable.  Repetitive spaced learning was employed today to elicit superior memory formation and behavioral change.  4. Class 2 severe obesity with serious comorbidity and body mass index (BMI) of 35.0 to 35.9 in adult, unspecified obesity type (HCC) Alyssa Blair is currently in the action stage of change. As such, her goal is to continue with weight loss efforts. She has agreed to the Category 3 Plan.   We discussed various medication options to help Alyssa Blair with her weight loss efforts and we both agreed to continue Alyssa Blair as prescribed below.. - Semaglutide-Weight Management (WEGOVY) 2.4 MG/0.75ML SOAJ; Inject 2.4 mg into the skin once a week.  Dispense: 3 mL; Refill: 0  Exercise goals: Pt is limited in activity due to right shoulder pain. She has been doing videos for physical activity since her last appointment.  Behavioral modification strategies: increasing lean protein intake, meal planning and cooking strategies, keeping healthy foods in the home and travel eating strategies.  Alyssa Blair has agreed to follow-up with our clinic in 3-4 weeks. She was informed of the importance of frequent follow-up visits to maximize her success with intensive lifestyle modifications for her multiple health conditions.   Objective:  Blood pressure 123/74, pulse 71, temperature 98.1 F (36.7 C), height 5\' 6"  (1.676 m), weight 197 lb (89.4 kg), SpO2 96 %. Body mass index is 31.8 kg/m.  General: Cooperative, alert, well developed, in no acute distress. HEENT: Conjunctivae  and lids unremarkable. Cardiovascular: Regular rhythm.  Lungs: Normal work of breathing. Neurologic: No focal deficits.   Lab Results  Component Value Date   CREATININE 0.78 03/09/2020   BUN 14 03/09/2020   NA 141 03/09/2020   K 4.4 03/09/2020   CL 103 03/09/2020   CO2 23 03/09/2020   Lab Results  Component Value Date   ALT 42 (H) 03/09/2020   AST 31 03/09/2020   ALKPHOS 132 (H) 03/09/2020   BILITOT 0.5 03/09/2020   Lab Results  Component Value Date   HGBA1C 5.4 03/09/2020   Lab Results  Component Value Date   INSULIN 16.8 03/09/2020   INSULIN 15.4 09/29/2019   Lab Results  Component Value Date   TSH 0.783 03/09/2020   No results found for: CHOL, HDL, LDLCALC, LDLDIRECT, TRIG, CHOLHDL Lab Results  Component Value Date   WBC 8.3 09/29/2019   HGB 13.4 09/29/2019   HCT 39.9 09/29/2019   MCV 91 09/29/2019   PLT 205 09/29/2019     Attestation Statements:   Reviewed by clinician on day of visit: allergies, medications, problem list, medical history, surgical history, family history, social history, and previous encounter notes.  10/01/2019, am acting as transcriptionist for Edmund Hilda, MD.   I have reviewed the above documentation for accuracy and completeness, and I agree with the above. - Reuben Likes, MD

## 2020-05-31 ENCOUNTER — Other Ambulatory Visit: Payer: Self-pay

## 2020-05-31 ENCOUNTER — Ambulatory Visit (INDEPENDENT_AMBULATORY_CARE_PROVIDER_SITE_OTHER): Payer: BC Managed Care – PPO | Admitting: Family Medicine

## 2020-05-31 ENCOUNTER — Encounter (INDEPENDENT_AMBULATORY_CARE_PROVIDER_SITE_OTHER): Payer: Self-pay | Admitting: Family Medicine

## 2020-05-31 VITALS — BP 123/66 | HR 69 | Temp 97.9°F | Ht 66.0 in | Wt 192.0 lb

## 2020-05-31 DIAGNOSIS — Z6835 Body mass index (BMI) 35.0-35.9, adult: Secondary | ICD-10-CM | POA: Diagnosis not present

## 2020-05-31 DIAGNOSIS — E559 Vitamin D deficiency, unspecified: Secondary | ICD-10-CM

## 2020-05-31 DIAGNOSIS — Z9189 Other specified personal risk factors, not elsewhere classified: Secondary | ICD-10-CM

## 2020-05-31 DIAGNOSIS — E039 Hypothyroidism, unspecified: Secondary | ICD-10-CM | POA: Diagnosis not present

## 2020-05-31 MED ORDER — VITAMIN D (ERGOCALCIFEROL) 1.25 MG (50000 UNIT) PO CAPS: 50000.0000 [IU] | ORAL_CAPSULE | ORAL | 0 refills | Status: DC

## 2020-05-31 MED ORDER — WEGOVY 2.4 MG/0.75ML ~~LOC~~ SOAJ: 2.4000 mg | SUBCUTANEOUS | 0 refills | Status: DC

## 2020-06-01 NOTE — Progress Notes (Signed)
Chief Complaint:   OBESITY Alyssa Blair is here to discuss her progress with her obesity treatment plan along with follow-up of her obesity related diagnoses. Alyssa Blair is on the Category 3 Plan and states she is following her eating plan approximately 90% of the time. Alyssa Blair states she is walking 60 minutes 7 times per week.  Today's visit was #: 13 Starting weight: 222 lbs Starting date: 09/29/2019 Today's weight: 192 lbs Today's date: 05/31/2020 Total lbs lost to date: 30 Total lbs lost since last in-office visit: 5  Interim History: Alyssa Blair returned to the office 2 days a week over the last 4 weeks. She went on a trip to [place] and did quite a bit of yard prep and planting. She is trying to stick to category 3 as strictly as she can.the days that she is in the office, she is bringing her food with her. She feels she is accomplishing goal in terms of weight.  Subjective:   1. Vitamin D deficiency Tora denies nausea, vomiting, and muscle weakness but notes fatigue. Pt is on prescription Vit D. Her last Vit D level was 60.  2. Hypothyroidism, unspecified type Alyssa Blair's last TSH was low but still within normal limits. She is on levothyroxine.  3. At risk for activity intolerance Alyssa Blair is at risk for exercise intolerance due to not doing resistance training.  Assessment/Plan:   1. Vitamin D deficiency Low Vitamin D level contributes to fatigue and are associated with obesity, breast, and colon cancer. She agrees to continue to take prescription Vitamin D @50 ,000 IU every week and will follow-up for routine testing of Vitamin D, at least 2-3 times per year to avoid over-replacement.  - Vitamin D, Ergocalciferol, (DRISDOL) 1.25 MG (50000 UNIT) CAPS capsule; Take 1 capsule (50,000 Units total) by mouth every 14 (fourteen) days.  Dispense: 4 capsule; Refill: 0  2. Hypothyroidism, unspecified type Patient with long-standing hypothyroidism, on levothyroxine therapy. She appears euthyroid. Orders and  follow up as documented in patient record. Repeat thyroid panel when pt has her yearly physical.  Counseling . Good thyroid control is important for overall health. Supratherapeutic thyroid levels are dangerous and will not improve weight loss results. . The correct way to take levothyroxine is fasting, with water, separated by at least 30 minutes from breakfast, and separated by more than 4 hours from calcium, iron, multivitamins, acid reflux medications (PPIs).   3. At risk for activity intolerance Alyssa Blair was given approximately 15 minutes of exercise intolerance counseling today. She is 65 y.o. female and has risk factors exercise intolerance including obesity. We discussed intensive lifestyle modifications today with an emphasis on specific weight loss instructions and strategies. Alyssa Blair will slowly increase activity as tolerated.  Repetitive spaced learning was employed today to elicit superior memory formation and behavioral change.  4. Class 2 severe obesity with serious comorbidity and body mass index (BMI) of 35.0 to 35.9 in adult, unspecified obesity type (HCC)  Alyssa Blair is currently in the action stage of change. As such, her goal is to continue with weight loss efforts. She has agreed to the Category 3 Plan.   We discussed various medication options to help Alyssa Blair with her weight loss efforts and we both agreed to continue Wegovy 2.4 mg, as prescribed below. - Semaglutide-Weight Management (WEGOVY) 2.4 MG/0.75ML SOAJ; Inject 2.4 mg into the skin once a week.  Dispense: 3 mL; Refill: 0  Exercise goals: As is plus resistance training 15-20 minutes 2 times a week.  Behavioral modification strategies:  increasing lean protein intake, meal planning and cooking strategies, keeping healthy foods in the home and planning for success.  Alyssa Blair has agreed to follow-up with our clinic in 3 weeks. She was informed of the importance of frequent follow-up visits to maximize her success with intensive lifestyle  modifications for her multiple health conditions.   Objective:   Blood pressure 123/66, pulse 69, temperature 97.9 F (36.6 C), height 5\' 6"  (1.676 m), weight 192 lb (87.1 kg), SpO2 98 %. Body mass index is 30.99 kg/m.  General: Cooperative, alert, well developed, in no acute distress. HEENT: Conjunctivae and lids unremarkable. Cardiovascular: Regular rhythm.  Lungs: Normal work of breathing. Neurologic: No focal deficits.   Lab Results  Component Value Date   CREATININE 0.78 03/09/2020   BUN 14 03/09/2020   NA 141 03/09/2020   K 4.4 03/09/2020   CL 103 03/09/2020   CO2 23 03/09/2020   Lab Results  Component Value Date   ALT 42 (H) 03/09/2020   AST 31 03/09/2020   ALKPHOS 132 (H) 03/09/2020   BILITOT 0.5 03/09/2020   Lab Results  Component Value Date   HGBA1C 5.4 03/09/2020   Lab Results  Component Value Date   INSULIN 16.8 03/09/2020   INSULIN 15.4 09/29/2019   Lab Results  Component Value Date   TSH 0.783 03/09/2020   No results found for: CHOL, HDL, LDLCALC, LDLDIRECT, TRIG, CHOLHDL Lab Results  Component Value Date   WBC 8.3 09/29/2019   HGB 13.4 09/29/2019   HCT 39.9 09/29/2019   MCV 91 09/29/2019   PLT 205 09/29/2019    Attestation Statements:   Reviewed by clinician on day of visit: allergies, medications, problem list, medical history, surgical history, family history, social history, and previous encounter notes.  10/01/2019, am acting as transcriptionist for Edmund Hilda, MD.   I have reviewed the above documentation for accuracy and completeness, and I agree with the above. - Reuben Likes, MD

## 2020-06-21 ENCOUNTER — Encounter (INDEPENDENT_AMBULATORY_CARE_PROVIDER_SITE_OTHER): Payer: Self-pay | Admitting: Family Medicine

## 2020-06-21 ENCOUNTER — Encounter (INDEPENDENT_AMBULATORY_CARE_PROVIDER_SITE_OTHER): Payer: Self-pay

## 2020-06-21 ENCOUNTER — Ambulatory Visit (INDEPENDENT_AMBULATORY_CARE_PROVIDER_SITE_OTHER): Payer: BC Managed Care – PPO | Admitting: Family Medicine

## 2020-06-21 ENCOUNTER — Telehealth (INDEPENDENT_AMBULATORY_CARE_PROVIDER_SITE_OTHER): Payer: Self-pay

## 2020-06-21 ENCOUNTER — Other Ambulatory Visit: Payer: Self-pay

## 2020-06-21 VITALS — BP 126/74 | HR 58 | Temp 97.9°F | Ht 66.0 in | Wt 194.0 lb

## 2020-06-21 DIAGNOSIS — E039 Hypothyroidism, unspecified: Secondary | ICD-10-CM | POA: Diagnosis not present

## 2020-06-21 DIAGNOSIS — R7401 Elevation of levels of liver transaminase levels: Secondary | ICD-10-CM | POA: Diagnosis not present

## 2020-06-21 DIAGNOSIS — Z6835 Body mass index (BMI) 35.0-35.9, adult: Secondary | ICD-10-CM | POA: Diagnosis not present

## 2020-06-21 DIAGNOSIS — E66812 Obesity, class 2: Secondary | ICD-10-CM

## 2020-06-21 NOTE — Telephone Encounter (Signed)
PA has been initiated via [email or web address] for [facility] 2.4mg /0.69mL once weekly injection.  Key: [id number] 2.4MG /0.75ML auto-injectors   Form: Consulting civil engineer Form (CB)   Determination: Wait for Determination Please wait for [employer] to return a determination.

## 2020-06-22 NOTE — Progress Notes (Signed)
Chief Complaint:   OBESITY Alyssa Blair is here to discuss her progress with her obesity treatment plan along with follow-up of her obesity related diagnoses. Alyssa Blair is on the Category 3 Plan and states she is following her eating plan approximately 90% of the time. Alyssa Blair states she is resistance exercises 10-15 minutes 5 times per week.  Today's visit was #: 14 Starting weight: 222 lbs Starting date: 09/29/2019 Today's weight: 194 lbs Today's date: 06/21/2020 Total lbs lost to date: 28 lbs Total lbs lost since last in-office visit: 0  Interim History: Alyssa Blair is packing lunch 3 days a week and walking 2 miles 3 days a week. She is trying to get all protein in during the day but doesn't think she is getting even 8 oz at dinner. She is doing [place] lettuce wraps for lunch. Pt is doing higher protein options during the day.  Subjective:   1. Hypothyroidism, unspecified type Alyssa Blair's last TSH was WNL. She is on levothyroxine 112 mcg.  2. Transaminitis Last ALT 42, AST 31, and alkaline phosphate 132. No ultrasound on file.  Assessment/Plan:   1. Hypothyroidism, unspecified type Patient with long-standing hypothyroidism, on levothyroxine therapy. She appears euthyroid. Orders and follow up as documented in patient record. -Continue levothyroxine with no change in dose.  Counseling . Good thyroid control is important for overall health. Supratherapeutic thyroid levels are dangerous and will not improve weight loss results. . The correct way to take levothyroxine is fasting, with water, separated by at least 30 minutes from breakfast, and separated by more than 4 hours from calcium, iron, multivitamins, acid reflux medications (PPIs).   2. Transaminitis Repeat labs at next appt.  3. Class 2 severe obesity with serious comorbidity and body mass index (BMI) of 35.0 to 35.9 in adult, unspecified obesity type (HCC) Alyssa Blair is currently in the action stage of change. As such, her goal is to continue with  weight loss efforts. She has agreed to the Category 3 Plan.   Exercise goals: As is  Behavioral modification strategies: increasing lean protein intake, meal planning and cooking strategies, keeping healthy foods in the home and planning for success.  Alyssa Blair has agreed to follow-up with our clinic in 3 weeks. She was informed of the importance of frequent follow-up visits to maximize her success with intensive lifestyle modifications for her multiple health conditions.   Objective:   Blood pressure 126/74, pulse (!) 58, temperature 97.9 F (36.6 C), height 5\' 6"  (1.676 m), weight 194 lb (88 kg), SpO2 98 %. Body mass index is 31.31 kg/m.  General: Cooperative, alert, well developed, in no acute distress. HEENT: Conjunctivae and lids unremarkable. Cardiovascular: Regular rhythm.  Lungs: Normal work of breathing. Neurologic: No focal deficits.   Lab Results  Component Value Date   CREATININE 0.78 03/09/2020   BUN 14 03/09/2020   NA 141 03/09/2020   K 4.4 03/09/2020   CL 103 03/09/2020   CO2 23 03/09/2020   Lab Results  Component Value Date   ALT 42 (H) 03/09/2020   AST 31 03/09/2020   ALKPHOS 132 (H) 03/09/2020   BILITOT 0.5 03/09/2020   Lab Results  Component Value Date   HGBA1C 5.4 03/09/2020   Lab Results  Component Value Date   INSULIN 16.8 03/09/2020   INSULIN 15.4 09/29/2019   Lab Results  Component Value Date   TSH 0.783 03/09/2020   No results found for: CHOL, HDL, LDLCALC, LDLDIRECT, TRIG, CHOLHDL Lab Results  Component Value Date  WBC 8.3 09/29/2019   HGB 13.4 09/29/2019   HCT 39.9 09/29/2019   MCV 91 09/29/2019   PLT 205 09/29/2019   No results found for: IRON, TIBC, FERRITIN   Attestation Statements:   Reviewed by clinician on day of visit: allergies, medications, problem list, medical history, surgical history, family history, social history, and previous encounter notes.   Edmund Hilda, CMA, am acting as transcriptionist for Reuben Likes, MD.   I have reviewed the above documentation for accuracy and completeness, and I agree with the above. - Katherina Mires, MD

## 2020-07-02 DIAGNOSIS — Z23 Encounter for immunization: Secondary | ICD-10-CM | POA: Diagnosis not present

## 2020-07-02 DIAGNOSIS — E039 Hypothyroidism, unspecified: Secondary | ICD-10-CM | POA: Diagnosis not present

## 2020-07-02 DIAGNOSIS — Z0000 Encounter for general adult medical examination without abnormal findings: Secondary | ICD-10-CM | POA: Diagnosis not present

## 2020-07-02 DIAGNOSIS — E559 Vitamin D deficiency, unspecified: Secondary | ICD-10-CM | POA: Diagnosis not present

## 2020-07-02 DIAGNOSIS — E7800 Pure hypercholesterolemia, unspecified: Secondary | ICD-10-CM | POA: Diagnosis not present

## 2020-07-02 DIAGNOSIS — E669 Obesity, unspecified: Secondary | ICD-10-CM | POA: Diagnosis not present

## 2020-07-02 DIAGNOSIS — I10 Essential (primary) hypertension: Secondary | ICD-10-CM | POA: Diagnosis not present

## 2020-07-02 LAB — BASIC METABOLIC PANEL
BUN: 17 (ref 4–21)
CO2: 28 — AB (ref 13–22)
Chloride: 102 (ref 99–108)
Creatinine: 0.8 (ref <=1.1)
Glucose: 87
Potassium: 3.9 (ref 3.4–5.3)
Sodium: 139 (ref 137–147)

## 2020-07-02 LAB — LIPID PANEL
Cholesterol: 172 (ref 0–200)
HDL: 61 (ref 35–70)
LDL Cholesterol: 101
Triglycerides: 54 (ref 40–160)

## 2020-07-02 LAB — HEPATIC FUNCTION PANEL
ALT: 21 (ref 7–35)
AST: 22 (ref 13–35)
Bilirubin, Total: 0.5

## 2020-07-02 LAB — COMPREHENSIVE METABOLIC PANEL
Albumin: 4.4 (ref 3.5–5.0)
Calcium: 10.2 (ref 8.7–10.7)
GFR calc non Af Amer: 80

## 2020-07-02 LAB — TSH: TSH: 0.06 — AB (ref <=5.90)

## 2020-07-02 LAB — VITAMIN D 25 HYDROXY (VIT D DEFICIENCY, FRACTURES): Vit D, 25-Hydroxy: 44.4

## 2020-07-07 ENCOUNTER — Encounter (INDEPENDENT_AMBULATORY_CARE_PROVIDER_SITE_OTHER): Payer: Self-pay

## 2020-07-08 DIAGNOSIS — Z13820 Encounter for screening for osteoporosis: Secondary | ICD-10-CM | POA: Diagnosis not present

## 2020-07-08 DIAGNOSIS — E559 Vitamin D deficiency, unspecified: Secondary | ICD-10-CM | POA: Diagnosis not present

## 2020-07-08 DIAGNOSIS — M85852 Other specified disorders of bone density and structure, left thigh: Secondary | ICD-10-CM | POA: Diagnosis not present

## 2020-07-08 DIAGNOSIS — E348 Other specified endocrine disorders: Secondary | ICD-10-CM | POA: Diagnosis not present

## 2020-07-13 ENCOUNTER — Other Ambulatory Visit: Payer: Self-pay

## 2020-07-13 ENCOUNTER — Encounter (INDEPENDENT_AMBULATORY_CARE_PROVIDER_SITE_OTHER): Payer: Self-pay | Admitting: Family Medicine

## 2020-07-13 ENCOUNTER — Ambulatory Visit (INDEPENDENT_AMBULATORY_CARE_PROVIDER_SITE_OTHER): Payer: BC Managed Care – PPO | Admitting: Family Medicine

## 2020-07-13 VITALS — BP 143/72 | HR 59 | Temp 98.3°F | Ht 66.0 in | Wt 192.0 lb

## 2020-07-13 DIAGNOSIS — R632 Polyphagia: Secondary | ICD-10-CM | POA: Diagnosis not present

## 2020-07-13 DIAGNOSIS — E038 Other specified hypothyroidism: Secondary | ICD-10-CM | POA: Diagnosis not present

## 2020-07-13 DIAGNOSIS — R7989 Other specified abnormal findings of blood chemistry: Secondary | ICD-10-CM | POA: Diagnosis not present

## 2020-07-13 DIAGNOSIS — Z9189 Other specified personal risk factors, not elsewhere classified: Secondary | ICD-10-CM

## 2020-07-13 DIAGNOSIS — Z6835 Body mass index (BMI) 35.0-35.9, adult: Secondary | ICD-10-CM

## 2020-07-13 DIAGNOSIS — E559 Vitamin D deficiency, unspecified: Secondary | ICD-10-CM | POA: Diagnosis not present

## 2020-07-13 MED ORDER — WEGOVY 2.4 MG/0.75ML ~~LOC~~ SOAJ
2.4000 mg | SUBCUTANEOUS | 0 refills | Status: DC
Start: 2020-07-13 — End: 2020-08-09

## 2020-07-14 ENCOUNTER — Ambulatory Visit (INDEPENDENT_AMBULATORY_CARE_PROVIDER_SITE_OTHER): Payer: BC Managed Care – PPO | Admitting: Family Medicine

## 2020-07-20 NOTE — Progress Notes (Signed)
Chief Complaint:   OBESITY Alyssa Blair is here to discuss her progress with her obesity treatment plan along with follow-up of her obesity related diagnoses.   Today's visit was #: 15 Starting weight: 222 lbs Starting date: 09/29/2019 Today's weight: 192 lbs Today's date: 07/13/2020 Weight change since last visit: 2 lbs Total lbs lost to date: 30 lbs Body mass index is 30.99 kg/m.  Total weight loss percentage to date: -13.51%  Interim History:  Alyssa Blair denies polyphagia.  She does not like a high amount of meat in her diet.  She is focused on protein.  Denies side effects from [facility].  Current Meal Plan: the Category 3 Plan for 90% of the time.  Current Exercise Plan: Resistance training for 10-15 minutes 7 times per week and walking 1-2 miles 2 times per week. Current Anti-Obesity Medications: Wegovy 2.4 mg subcutaneously weekly. Side effects: None.  Assessment/Plan:   Meds ordered this encounter  Medications   Semaglutide-Weight Management (WEGOVY) 2.4 MG/0.75ML SOAJ    Sig: Inject 2.4 mg into the skin once a week.    Dispense:  3 mL    Refill:  0   1. Polyphagia Controlled. Current treatment: Wegovy 2.4 mg subcutaneously weekly. Polyphagia refers to excessive feelings of hunger. She will continue to focus on protein-rich, low simple carbohydrate foods. We reviewed the importance of hydration, regular exercise for stress reduction, and restorative sleep.  Plan:  Continue [id number].  Will refill today, as per below.  - Refill Semaglutide-Weight Management (WEGOVY) 2.4 MG/0.75ML SOAJ; Inject 2.4 mg into the skin once a week.  Dispense: 3 mL; Refill: 0  2. Other specified hypothyroidism Course: Recent TSH showing over-treatment with subsequent decrease in levothyroxine.  Needs recheck in 4 weeks at next visit.  Medication: levothyroxine 112 mcg daily.   Plan: Patient was instructed not to take MVM or iron within 4 hours of taking thyroid medications. We will continue to monitor  alongside Endocrinology/PCP as it relates to her weight loss journey.  Recheck TSH at next visit in 4 weeks.  Lab Results  Component Value Date   TSH 0.06 (A) 07/02/2020   3. Elevated LFTs Resolved.  We will continue to monitor symptoms as they relate to her weight loss journey.  Lab Results  Component Value Date   ALT 21 07/02/2020   AST 22 07/02/2020   ALKPHOS 132 (H) 03/09/2020   BILITOT 0.5 03/09/2020   4. Vitamin D deficiency Improving, but not optimized. Current vitamin D is 44.4, tested on 07/02/2020. Optimal goal > 50 ng/dL.   Plan: Continue to take prescription Vitamin D @50 ,000 IU every week as prescribed.  Follow-up for routine testing of Vitamin D, at least 2-3 times per year to avoid over-replacement.  5. At risk for heart disease Due to Alyssa Blair's current state of health and medical condition(s), she is at a higher risk for heart disease.  This puts the patient at much greater risk to subsequently develop cardiopulmonary conditions that can significantly affect patient's quality of life in a negative manner.    At least 8 minutes were spent on counseling Alyssa Blair about these concerns today,. Evidence-based interventions for health behavior change were utilized today including the discussion of self monitoring techniques, problem-solving barriers, and SMART goal setting techniques.  Specifically, regarding patient's less desirable eating habits and patterns, we employed the technique of small changes when Alyssa Blair has not been able to fully commit to her prudent nutritional plan.  6. Obesity, current BMI 31.1  Course: Alyssa Blair  is currently in the action stage of change. As such, her goal is to continue with weight loss efforts.   Nutrition goals: She has agreed to practicing portion control and making smarter food choices, such as increasing vegetables and decreasing simple carbohydrates.   Exercise goals:  As is.  Behavioral modification strategies: increasing lean protein intake,  decreasing simple carbohydrates, and increasing vegetables.  Alyssa Blair has agreed to follow-up with our clinic in 4 weeks. She was informed of the importance of frequent follow-up visits to maximize her success with intensive lifestyle modifications for her multiple health conditions.   Objective:   Blood pressure (!) 143/72, pulse (!) 59, temperature 98.3 F (36.8 C), temperature source Oral, height 5\' 6"  (1.676 m), weight 192 lb (87.1 kg), SpO2 96 %. Body mass index is 30.99 kg/m.  General: Cooperative, alert, well developed, in no acute distress. HEENT: Conjunctivae and lids unremarkable. Cardiovascular: Regular rhythm.  Lungs: Normal work of breathing. Neurologic: No focal deficits.   Lab Results  Component Value Date   CREATININE 0.8 07/02/2020   BUN 17 07/02/2020   NA 139 07/02/2020   K 3.9 07/02/2020   CL 102 07/02/2020   CO2 28 (A) 07/02/2020   Lab Results  Component Value Date   ALT 21 07/02/2020   AST 22 07/02/2020   ALKPHOS 132 (H) 03/09/2020   BILITOT 0.5 03/09/2020   Lab Results  Component Value Date   HGBA1C 5.4 03/09/2020   Lab Results  Component Value Date   INSULIN 16.8 03/09/2020   INSULIN 15.4 09/29/2019   Lab Results  Component Value Date   TSH 0.06 (A) 07/02/2020   Lab Results  Component Value Date   CHOL 172 07/02/2020   HDL 61 07/02/2020   LDLCALC 101 07/02/2020   TRIG 54 07/02/2020   Lab Results  Component Value Date   WBC 8.3 09/29/2019   HGB 13.4 09/29/2019   HCT 39.9 09/29/2019   MCV 91 09/29/2019   PLT 205 09/29/2019   Attestation Statements:   Reviewed by clinician on day of visit: allergies, medications, problem list, medical history, surgical history, family history, social history, and previous encounter notes.  I, 10/01/2019, CMA, am acting as transcriptionist for Insurance claims handler, DO  I have reviewed the above documentation for accuracy and completeness, and I agree with the above. Helane Rima, DO

## 2020-08-02 DIAGNOSIS — S81801A Unspecified open wound, right lower leg, initial encounter: Secondary | ICD-10-CM | POA: Diagnosis not present

## 2020-08-09 ENCOUNTER — Other Ambulatory Visit (INDEPENDENT_AMBULATORY_CARE_PROVIDER_SITE_OTHER): Payer: Self-pay | Admitting: Family Medicine

## 2020-08-09 DIAGNOSIS — R632 Polyphagia: Secondary | ICD-10-CM

## 2020-08-10 ENCOUNTER — Ambulatory Visit (INDEPENDENT_AMBULATORY_CARE_PROVIDER_SITE_OTHER): Payer: BC Managed Care – PPO | Admitting: Family Medicine

## 2020-08-10 MED ORDER — WEGOVY 2.4 MG/0.75ML ~~LOC~~ SOAJ: 2.4000 mg | SUBCUTANEOUS | 0 refills | Status: DC

## 2020-08-10 NOTE — Telephone Encounter (Signed)
Last OV with Dr Wallace 

## 2020-08-12 DIAGNOSIS — S81801D Unspecified open wound, right lower leg, subsequent encounter: Secondary | ICD-10-CM | POA: Diagnosis not present

## 2020-08-12 DIAGNOSIS — L03115 Cellulitis of right lower limb: Secondary | ICD-10-CM | POA: Diagnosis not present

## 2020-08-12 DIAGNOSIS — Z4802 Encounter for removal of sutures: Secondary | ICD-10-CM | POA: Diagnosis not present

## 2020-08-14 DIAGNOSIS — L03115 Cellulitis of right lower limb: Secondary | ICD-10-CM | POA: Diagnosis not present

## 2020-09-02 ENCOUNTER — Encounter (INDEPENDENT_AMBULATORY_CARE_PROVIDER_SITE_OTHER): Payer: Self-pay | Admitting: Family Medicine

## 2020-09-02 ENCOUNTER — Other Ambulatory Visit: Payer: Self-pay

## 2020-09-02 ENCOUNTER — Ambulatory Visit (INDEPENDENT_AMBULATORY_CARE_PROVIDER_SITE_OTHER): Payer: BC Managed Care – PPO | Admitting: Family Medicine

## 2020-09-02 VITALS — BP 128/73 | HR 67 | Temp 98.0°F | Ht 66.0 in | Wt 187.0 lb

## 2020-09-02 DIAGNOSIS — Z6835 Body mass index (BMI) 35.0-35.9, adult: Secondary | ICD-10-CM

## 2020-09-02 DIAGNOSIS — R632 Polyphagia: Secondary | ICD-10-CM | POA: Diagnosis not present

## 2020-09-02 DIAGNOSIS — Z9189 Other specified personal risk factors, not elsewhere classified: Secondary | ICD-10-CM

## 2020-09-02 DIAGNOSIS — F439 Reaction to severe stress, unspecified: Secondary | ICD-10-CM | POA: Diagnosis not present

## 2020-09-02 DIAGNOSIS — E038 Other specified hypothyroidism: Secondary | ICD-10-CM | POA: Diagnosis not present

## 2020-09-02 DIAGNOSIS — E039 Hypothyroidism, unspecified: Secondary | ICD-10-CM

## 2020-09-02 DIAGNOSIS — E66812 Obesity, class 2: Secondary | ICD-10-CM

## 2020-09-02 MED ORDER — WEGOVY 2.4 MG/0.75ML ~~LOC~~ SOAJ: 2.4000 mg | SUBCUTANEOUS | 0 refills | Status: DC

## 2020-09-02 NOTE — Progress Notes (Signed)
Chief Complaint:   OBESITY Alyssa Blair is here to discuss her progress with her obesity treatment plan along with follow-up of her obesity related diagnoses. See Medical Weight Management Flowsheet for complete bioelectrical impedance results.  Today's visit was #: 16 Starting weight: 222 lbs Starting date: 09/29/2019 Today's weight: 187 lbs Today's date: 09/02/2020 Weight change since last visit: 5 lbs Total lbs lost to date: 32 lbs Body mass index is 30.18 kg/m.  Total weight loss percentage to date: -15.77%  Interim History: Alyssa Blair is 50 pounds down!  She says she will celebrate her 31st wedding anniversary tonight.  She has been under increased stress.  She is worried about her son's health.  Nutrition Plan: practicing portion control and making smarter food choices, such as increasing vegetables and decreasing simple carbohydrates for 90% of the time. Activity:  None. Anti-obesity medications: Wegovy 2.4 mg subcutaneously weekly. Reported side effects: None.  Assessment/Plan:   1. Polyphagia At goal. Current treatment: Wegovy 2.4 mg subcutaneously weekly. She will continue to focus on protein-rich, low simple carbohydrate foods. We reviewed the importance of hydration, regular exercise for stress reduction, and restorative sleep.  - Refill Semaglutide-Weight Management (WEGOVY) 2.4 MG/0.75ML SOAJ; Inject 2.4 mg into the skin once a week.  Dispense: 9 mL; Refill: 0  2. Acquired hypothyroidism Medication: levothyroxine 112 mcg daily.   Plan: Patient was instructed not to take MVM or iron within 4 hours of taking thyroid medications. We will continue to monitor alongside Endocrinology/PCP as it relates to her weight loss journey.  Will check TSH and free T4 today.  Lab Results  Component Value Date   TSH 0.279 (L) 09/02/2020   - TSH - T4, free  3. Situational stress Alyssa Blair is taking Effexor 75 mg twice daily.  Plan:  Behavior modification techniques were discussed today to  help Alyssa Blair deal with her anxiety.   4. At risk for heart disease Due to Alyssa Blair's current state of health and medical condition(s), she is at a higher risk for heart disease.   This puts the patient at much greater risk to subsequently develop cardiopulmonary conditions that can significantly affect patient's quality of life in a negative manner as well.    At least 9 minutes was spent on counseling Alyssa Blair about these concerns today. Initial goal is to lose at least 5-10% of starting weight to help reduce these risk factors.  We will continue to reassess these conditions on a fairly regular basis in an attempt to decrease patient's overall morbidity and mortality.  Evidence-based interventions for health behavior change were utilized today including the discussion of self monitoring techniques, problem-solving barriers and SMART goal setting techniques.  Specifically regarding patient's less desirable eating habits and patterns, we employed the technique of small changes when Alyssa Blair has not been able to fully commit to her prudent nutritional plan.  5. Obesity, current BMI 30.2  Course: Alyssa Blair is currently in the action stage of change. As such, her goal is to continue with weight loss efforts.   Nutrition goals: She has agreed to practicing portion control and making smarter food choices, such as increasing vegetables and decreasing simple carbohydrates.   Exercise goals: All adults should avoid inactivity. Some physical activity is better than none, and adults who participate in any amount of physical activity gain some health benefits.  Behavioral modification strategies: increasing lean protein intake, decreasing simple carbohydrates, increasing vegetables, and increasing water intake.  Alyssa Blair has agreed to follow-up with our clinic in 4 weeks.  She was informed of the importance of frequent follow-up visits to maximize her success with intensive lifestyle modifications for her multiple health conditions.    Alyssa Blair was informed we would discuss her lab results at her next visit unless there is a critical issue that needs to be addressed sooner. Alyssa Blair agreed to keep her next visit at the agreed upon time to discuss these results.  Objective:   Blood pressure 128/73, pulse 67, temperature 98 F (36.7 C), temperature source Oral, height 5\' 6"  (1.676 m), weight 187 lb (84.8 kg), SpO2 96 %. Body mass index is 30.18 kg/m.  General: Cooperative, alert, well developed, in no acute distress. HEENT: Conjunctivae and lids unremarkable. Cardiovascular: Regular rhythm.  Lungs: Normal work of breathing. Neurologic: No focal deficits.   Lab Results  Component Value Date   CREATININE 0.8 07/02/2020   BUN 17 07/02/2020   NA 139 07/02/2020   K 3.9 07/02/2020   CL 102 07/02/2020   CO2 28 (A) 07/02/2020   Lab Results  Component Value Date   ALT 21 07/02/2020   AST 22 07/02/2020   ALKPHOS 132 (H) 03/09/2020   BILITOT 0.5 03/09/2020   Lab Results  Component Value Date   HGBA1C 5.4 03/09/2020   Lab Results  Component Value Date   INSULIN 16.8 03/09/2020   INSULIN 15.4 09/29/2019   Lab Results  Component Value Date   TSH 0.06 (A) 07/02/2020   Lab Results  Component Value Date   CHOL 172 07/02/2020   HDL 61 07/02/2020   LDLCALC 101 07/02/2020   TRIG 54 07/02/2020   Lab Results  Component Value Date   VD25OH 44.4 07/02/2020   VD25OH 60.8 03/09/2020   VD25OH 37.3 09/29/2019   Lab Results  Component Value Date   WBC 8.3 09/29/2019   HGB 13.4 09/29/2019   HCT 39.9 09/29/2019   MCV 91 09/29/2019   PLT 205 09/29/2019   Attestation Statements:   Reviewed by clinician on day of visit: allergies, medications, problem list, medical history, surgical history, family history, social history, and previous encounter notes.  I, 10/01/2019, CMA, am acting as transcriptionist for Insurance claims handler, DO  I have reviewed the above documentation for accuracy and completeness, and I agree with the  above. Helane Rima, DO

## 2020-09-03 LAB — TSH: TSH: 0.279 u[IU]/mL — ABNORMAL LOW (ref 0.450–4.500)

## 2020-09-03 LAB — T4, FREE: Free T4: 1.56 ng/dL (ref 0.82–1.77)

## 2020-09-30 ENCOUNTER — Encounter (INDEPENDENT_AMBULATORY_CARE_PROVIDER_SITE_OTHER): Payer: Self-pay | Admitting: Family Medicine

## 2020-09-30 ENCOUNTER — Other Ambulatory Visit: Payer: Self-pay

## 2020-09-30 ENCOUNTER — Ambulatory Visit (INDEPENDENT_AMBULATORY_CARE_PROVIDER_SITE_OTHER): Payer: BC Managed Care – PPO | Admitting: Family Medicine

## 2020-09-30 VITALS — BP 136/69 | HR 63 | Temp 97.9°F | Ht 66.0 in | Wt 186.0 lb

## 2020-09-30 DIAGNOSIS — E038 Other specified hypothyroidism: Secondary | ICD-10-CM

## 2020-09-30 DIAGNOSIS — F4321 Adjustment disorder with depressed mood: Secondary | ICD-10-CM | POA: Diagnosis not present

## 2020-09-30 DIAGNOSIS — R632 Polyphagia: Secondary | ICD-10-CM | POA: Diagnosis not present

## 2020-09-30 DIAGNOSIS — G4709 Other insomnia: Secondary | ICD-10-CM | POA: Diagnosis not present

## 2020-09-30 DIAGNOSIS — Z9189 Other specified personal risk factors, not elsewhere classified: Secondary | ICD-10-CM | POA: Diagnosis not present

## 2020-09-30 DIAGNOSIS — Z6835 Body mass index (BMI) 35.0-35.9, adult: Secondary | ICD-10-CM

## 2020-09-30 MED ORDER — TRAZODONE HCL 50 MG PO TABS: 50.0000 mg | ORAL_TABLET | Freq: Every day | ORAL | 0 refills | Status: DC

## 2020-09-30 MED ORDER — WEGOVY 2.4 MG/0.75ML ~~LOC~~ SOAJ: 2.4000 mg | SUBCUTANEOUS | 0 refills | Status: DC

## 2020-10-05 NOTE — Progress Notes (Signed)
Chief Complaint:   OBESITY Alyssa Blair is here to discuss her progress with her obesity treatment plan along with follow-up of her obesity related diagnoses.   Today's visit was #: 17 Starting weight: 222 lbs Starting date: 09/29/2019 Today's weight: 186 lbs Today's date: 09/30/2020 Weight change since last visit: 1 lb Total lbs lost to date: 33 lbs Body mass index is 30.02 kg/m.  Total weight loss percentage to date: -16.22%  Current Meal Plan: practicing portion control and making smarter food choices, such as increasing vegetables and decreasing simple carbohydrates for 95% of the time.  Current Exercise Plan: None. Current Anti-Obesity Medications: Wegovy 2.4 mg subcutaneously weekly. Side effects: None.  Interim History:  Alyssa Blair is on Wegovy 2.4 mg weekly.  Her son died since our last visit. She is grieving.  Assessment/Plan:   1. Other insomnia This is due to grief.  Current treatment: None.  Plan:  Start trazodone 50 mg at bedtime.  Recommend sleep hygiene measures including regular sleep schedule, optimal sleep environment, and relaxing presleep rituals.   - Start traZODone (DESYREL) 50 MG tablet; Take 1 tablet (50 mg total) by mouth at bedtime.  Dispense: 30 tablet; Refill: 0  2. Other specified hypothyroidism Still over treated.  Medication: levothyroxine 112 mcg daily.   Plan:  Take 110 mcg daily (do not alternate with 112 mcg).  We will recheck at next visit.  Lab Results  Component Value Date   TSH 0.279 (L) 09/02/2020   3. Grief Sadly, her son died recently. Hospice was involved.  She plans on getting therapy.  4. Polyphagia Controlled. Current treatment: Wegovy 2.4 mg subcutaneously weekly. She will continue to focus on protein-rich, low simple carbohydrate foods. We reviewed the importance of hydration, regular exercise for stress reduction, and restorative sleep.  Plan:  Continue Wegovy 2.4 mg subcutaneously weekly.  Will refill today.  - Refill  Semaglutide-Weight Management (WEGOVY) 2.4 MG/0.75ML SOAJ; Inject 2.4 mg into the skin once a week.  Dispense: 9 mL; Refill: 0  5. At risk for deficient intake of food Alyssa Blair was given extensive education and counseling today of more than 8 minutes on risks associated with deficient food intake.  Counseled her on the importance of following our prescribed meal plan and eating adequate amounts of protein.  Discussed with Alyssa Blair that inadequate food intake over longer periods of time can slow their metabolism down significantly.   6. Obesity, current BMI 30  Course: Alyssa Blair is currently in the action stage of change. As such, her goal is to continue with weight loss efforts.   Nutrition goals: She has agreed to practicing portion control and making smarter food choices, such as increasing vegetables and decreasing simple carbohydrates.   Exercise goals:  Exercise for stress reduction.  Behavioral modification strategies: increasing lean protein intake and increasing water intake.  Alyssa Blair has agreed to follow-up with our clinic in 4 weeks. She was informed of the importance of frequent follow-up visits to maximize her success with intensive lifestyle modifications for her multiple health conditions.   Objective:   Blood pressure 136/69, pulse 63, temperature 97.9 F (36.6 C), temperature source Oral, height 5\' 6"  (1.676 m), weight 186 lb (84.4 kg), SpO2 94 %. Body mass index is 30.02 kg/m.  General: Cooperative, alert, well developed, in no acute distress. HEENT: Conjunctivae and lids unremarkable. Cardiovascular: Regular rhythm.  Lungs: Normal work of breathing. Neurologic: No focal deficits.   Lab Results  Component Value Date   CREATININE 0.8 07/02/2020  BUN 17 07/02/2020   NA 139 07/02/2020   K 3.9 07/02/2020   CL 102 07/02/2020   CO2 28 (A) 07/02/2020   Lab Results  Component Value Date   ALT 21 07/02/2020   AST 22 07/02/2020   ALKPHOS 132 (H) 03/09/2020   BILITOT 0.5 03/09/2020    Lab Results  Component Value Date   HGBA1C 5.4 03/09/2020   Lab Results  Component Value Date   INSULIN 16.8 03/09/2020   INSULIN 15.4 09/29/2019   Lab Results  Component Value Date   TSH 0.279 (L) 09/02/2020   Lab Results  Component Value Date   CHOL 172 07/02/2020   HDL 61 07/02/2020   LDLCALC 101 07/02/2020   TRIG 54 07/02/2020   Lab Results  Component Value Date   VD25OH 44.4 07/02/2020   VD25OH 60.8 03/09/2020   VD25OH 37.3 09/29/2019   Lab Results  Component Value Date   WBC 8.3 09/29/2019   HGB 13.4 09/29/2019   HCT 39.9 09/29/2019   MCV 91 09/29/2019   PLT 205 09/29/2019   Attestation Statements:   Reviewed by clinician on day of visit: allergies, medications, problem list, medical history, surgical history, family history, social history, and previous encounter notes.  I, Insurance claims handler, CMA, am acting as transcriptionist for Helane Rima, DO  I have reviewed the above documentation for accuracy and completeness, and I agree with the above. Helane Rima, DO

## 2020-10-08 ENCOUNTER — Encounter (INDEPENDENT_AMBULATORY_CARE_PROVIDER_SITE_OTHER): Payer: Self-pay | Admitting: Family Medicine

## 2020-10-08 NOTE — Telephone Encounter (Signed)
Last OV with Dr Wallace 

## 2020-10-11 NOTE — Telephone Encounter (Signed)
Please review and advise.

## 2020-11-01 ENCOUNTER — Encounter (INDEPENDENT_AMBULATORY_CARE_PROVIDER_SITE_OTHER): Payer: Self-pay

## 2020-11-02 ENCOUNTER — Other Ambulatory Visit: Payer: Self-pay

## 2020-11-02 ENCOUNTER — Encounter (INDEPENDENT_AMBULATORY_CARE_PROVIDER_SITE_OTHER): Payer: Self-pay | Admitting: Family Medicine

## 2020-11-02 ENCOUNTER — Ambulatory Visit (INDEPENDENT_AMBULATORY_CARE_PROVIDER_SITE_OTHER): Payer: BC Managed Care – PPO | Admitting: Family Medicine

## 2020-11-02 VITALS — BP 125/66 | HR 63 | Temp 97.8°F | Ht 66.0 in | Wt 184.0 lb

## 2020-11-02 DIAGNOSIS — E559 Vitamin D deficiency, unspecified: Secondary | ICD-10-CM

## 2020-11-02 DIAGNOSIS — Z9189 Other specified personal risk factors, not elsewhere classified: Secondary | ICD-10-CM

## 2020-11-02 DIAGNOSIS — Z6835 Body mass index (BMI) 35.0-35.9, adult: Secondary | ICD-10-CM

## 2020-11-02 DIAGNOSIS — E66812 Obesity, class 2: Secondary | ICD-10-CM

## 2020-11-02 DIAGNOSIS — F4321 Adjustment disorder with depressed mood: Secondary | ICD-10-CM

## 2020-11-02 DIAGNOSIS — R632 Polyphagia: Secondary | ICD-10-CM

## 2020-11-02 MED ORDER — HYDROXYZINE PAMOATE 25 MG PO CAPS
25.0000 mg | ORAL_CAPSULE | Freq: Three times a day (TID) | ORAL | 0 refills | Status: DC | PRN
Start: 2020-11-02 — End: 2020-11-29

## 2020-11-02 MED ORDER — WEGOVY 2.4 MG/0.75ML ~~LOC~~ SOAJ
2.4000 mg | SUBCUTANEOUS | 0 refills | Status: DC
Start: 2020-11-02 — End: 2020-11-29

## 2020-11-03 NOTE — Progress Notes (Signed)
Chief Complaint:   OBESITY Alyssa Blair is here to discuss her progress with her obesity treatment plan along with follow-up of her obesity related diagnoses.   Today's visit was #: 18 Starting weight: 222 lbs Starting date: 09/29/2019 Today's weight: 184 lbs Today's date: 11/02/2020 Weight change since last visit: 2 lbs Total lbs lost to date: 38 lbs Body mass index is 29.7 kg/m.  Total weight loss percentage to date: -17.12%  Current Meal Plan: practicing portion control and making smarter food choices, such as increasing vegetables and decreasing simple carbohydrates for 80% of the time.  Current Exercise Plan: None. Current Anti-Obesity Medications: Wegovy 2.4 mg subcutaneously weekly. Side effects: None.  Interim History:  Alyssa Blair will retire on 4/30.  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  She has an appointment with a therapist in December to work on managing grief. Other stressors: There is currently a tree down in her living room.  Assessment/Plan:   1. Polyphagia At goal. Current treatment: Wegovy 2.4 mg subcutaneously weekly. She will continue to focus on protein-rich, low simple carbohydrate foods. We reviewed the importance of hydration, regular exercise for stress reduction, and restorative sleep.  Plan:  Continue Wegovy 2.4 mg subcutaneously weekly.  Will refill today, as per below.  - Refill Semaglutide-Weight Management (WEGOVY) 2.4 MG/0.75ML SOAJ; Inject 2.4 mg into the skin once a week.  Dispense: 9 mL; Refill: 0  2. Grief She is seeing a Firefighter. The counselor recommended Hydroxyzine as an alternative to Trazodone for anxiety and sleep. Risk versus benefits of medication reviewed. The patient understands monitoring parameters and red flags.   Plan:  Start hydroxyzine 25 mg daily every 8 hours as needed.  - Start hydrOXYzine (VISTARIL) 25 MG capsule; Take 1 capsule (25 mg total)  by mouth every 8 (eight) hours as needed.  Dispense: 30 capsule; Refill: 0  3. Vitamin D deficiency Improving, but not optimized.  She is taking a multivitamin.  Plan:  Continue multivitamin.  Follow-up for routine testing of Vitamin D, at least 2-3 times per year to avoid over-replacement.  Lab Results  Component Value Date   VD25OH 44.4 07/02/2020   VD25OH 60.8 03/09/2020   VD25OH 37.3 09/29/2019   4. Obesity, current BMI 29.8  Course: Alyssa Blair is currently in the action stage of change. As such, her goal is to continue with weight loss efforts.   Nutrition goals: She has agreed to practicing portion control and making smarter food choices, such as increasing vegetables and decreasing simple carbohydrates.   Exercise goals: For substantial health benefits, adults should do at least 150 minutes (2 hours and 30 minutes) a week of moderate-intensity, or 75 minutes (1 hour and 15 minutes) a week of vigorous-intensity aerobic physical activity, or an equivalent combination of moderate- and vigorous-intensity aerobic activity. Aerobic activity should be performed in episodes of at least 10 minutes, and preferably, it should be spread throughout the week.  Behavioral modification strategies: increasing lean protein intake, decreasing simple carbohydrates, increasing vegetables, increasing water intake, and emotional eating strategies.  Alyssa Blair has agreed to follow-up with our clinic in 4 weeks. She was informed of the importance of frequent follow-up visits to maximize her success with intensive lifestyle modifications for her multiple health conditions.   Objective:   Blood pressure 125/66, pulse 63, temperature 97.8 F (36.6 C), temperature source Oral, height 5\' 6"  (1.676 m), weight 184 lb (83.5 kg), SpO2  95 %. Body mass index is 29.7 kg/m.  General: Cooperative, alert, well developed, in no acute distress. HEENT: Conjunctivae and lids unremarkable. Cardiovascular: Regular rhythm.  Lungs:  Normal work of breathing. Neurologic: No focal deficits.   Lab Results  Component Value Date   CREATININE 0.8 07/02/2020   BUN 17 07/02/2020   NA 139 07/02/2020   K 3.9 07/02/2020   CL 102 07/02/2020   CO2 28 (A) 07/02/2020   Lab Results  Component Value Date   ALT 21 07/02/2020   AST 22 07/02/2020   ALKPHOS 132 (H) 03/09/2020   BILITOT 0.5 03/09/2020   Lab Results  Component Value Date   HGBA1C 5.4 03/09/2020   Lab Results  Component Value Date   INSULIN 16.8 03/09/2020   INSULIN 15.4 09/29/2019   Lab Results  Component Value Date   TSH 0.279 (L) 09/02/2020   Lab Results  Component Value Date   CHOL 172 07/02/2020   HDL 61 07/02/2020   LDLCALC 101 07/02/2020   TRIG 54 07/02/2020   Lab Results  Component Value Date   VD25OH 44.4 07/02/2020   VD25OH 60.8 03/09/2020   VD25OH 37.3 09/29/2019   Lab Results  Component Value Date   WBC 8.3 09/29/2019   HGB 13.4 09/29/2019   HCT 39.9 09/29/2019   MCV 91 09/29/2019   PLT 205 09/29/2019   Attestation Statements:   Reviewed by clinician on day of visit: allergies, medications, problem list, medical history, surgical history, family history, social history, and previous encounter notes.  I, Insurance claims handler, CMA, am acting as transcriptionist for Helane Rima, DO  I have reviewed the above documentation for accuracy and completeness, and I agree with the above. -  Helane Rima, DO, MS, FAAFP, DABOM - Family and Bariatric Medicine.

## 2020-11-23 DIAGNOSIS — Z1231 Encounter for screening mammogram for malignant neoplasm of breast: Secondary | ICD-10-CM | POA: Diagnosis not present

## 2020-11-29 ENCOUNTER — Other Ambulatory Visit: Payer: Self-pay

## 2020-11-29 ENCOUNTER — Ambulatory Visit (INDEPENDENT_AMBULATORY_CARE_PROVIDER_SITE_OTHER): Payer: BC Managed Care – PPO | Admitting: Family Medicine

## 2020-11-29 ENCOUNTER — Encounter (INDEPENDENT_AMBULATORY_CARE_PROVIDER_SITE_OTHER): Payer: Self-pay | Admitting: Family Medicine

## 2020-11-29 VITALS — BP 125/75 | HR 66 | Temp 98.2°F | Ht 66.0 in | Wt 184.0 lb

## 2020-11-29 DIAGNOSIS — F4321 Adjustment disorder with depressed mood: Secondary | ICD-10-CM | POA: Diagnosis not present

## 2020-11-29 DIAGNOSIS — I10 Essential (primary) hypertension: Secondary | ICD-10-CM | POA: Diagnosis not present

## 2020-11-29 DIAGNOSIS — Z6835 Body mass index (BMI) 35.0-35.9, adult: Secondary | ICD-10-CM

## 2020-11-29 DIAGNOSIS — R632 Polyphagia: Secondary | ICD-10-CM

## 2020-11-29 MED ORDER — HYDROCHLOROTHIAZIDE 12.5 MG PO TABS: 12.5000 mg | ORAL_TABLET | Freq: Every day | ORAL | 0 refills | Status: AC

## 2020-11-29 MED ORDER — WEGOVY 2.4 MG/0.75ML ~~LOC~~ SOAJ: 2.4000 mg | SUBCUTANEOUS | 0 refills | Status: DC

## 2020-11-29 MED ORDER — HYDROXYZINE PAMOATE 25 MG PO CAPS: 25.0000 mg | ORAL_CAPSULE | Freq: Three times a day (TID) | ORAL | 3 refills | Status: AC | PRN

## 2020-11-30 NOTE — Progress Notes (Signed)
Chief Complaint:   OBESITY Alyssa Blair is here to discuss her progress with her obesity treatment plan along with follow-up of her obesity related diagnoses. See Medical Weight Management Flowsheet for complete bioelectrical impedance results.  Today's visit was #: 19 Starting weight: 222 lbs Starting date: 09/29/2019 Weight change since last visit: 0 Total lbs lost to date: 38 lbs Total weight loss percentage to date: -17.12%  Nutrition Plan: Practicing portion control and making smarter food choices, such as increasing vegetables and decreasing simple carbohydrates for 80% of the time. Activity: None. Anti-obesity medications: Wegovy 2.4 mg subcutaneously weekly. Reported side effects: None.  Interim History: Alyssa Blair's retirement date is April 30.   She says she is working with a Psychologist, prison and probation services. She is still very angry with her son's wife.  Assessment/Plan:   1. Polyphagia Controlled. Current treatment: Wegovy 2.5 mg subcutaneously weekly.    Plan:  Continue Wegovy 2.4 mg subcutaneously weekly.  Will refill today, as per below.  She will continue to focus on protein-rich, low simple carbohydrate foods. We reviewed the importance of hydration, regular exercise for stress reduction, and restorative sleep.  - Refill Semaglutide-Weight Management (WEGOVY) 2.4 MG/0.75ML SOAJ; Inject 2.4 mg into the skin once a week.  Dispense: 9 mL; Refill: 0  2. Essential hypertension At goal. Medications: HCTZ 12.5 mg daily.   Plan:  Continue HCTZ 12.5 mg daily.  Avoid buying foods that are: processed, frozen, or prepackaged to avoid excess salt. We will watch for signs of hypotension as she continues lifestyle modifications.  BP Readings from Last 3 Encounters:  11/29/20 125/75  11/02/20 125/66  09/30/20 136/69   Lab Results  Component Value Date   CREATININE 0.8 07/02/2020   - Refill hydrochlorothiazide (HYDRODIURIL) 12.5 MG tablet; Take 1 tablet (12.5 mg total) by mouth daily.  Dispense: 90  tablet; Refill: 0  3. Grief Alyssa Blair has been seeing a Firefighter.  She has been taking hydroxyzine 25 mg every 8 hours as needed for anxiety.  Plan:  Continue hydroxyzine at current dose.  Will refill today.  - Refill hydrOXYzine (VISTARIL) 25 MG capsule; Take 1 capsule (25 mg total) by mouth every 8 (eight) hours as needed.  Dispense: 30 capsule; Refill: 3  4. Class 2 severe obesity with serious comorbidity and body mass index (BMI) of 35.0 to 35.9 in adult, unspecified obesity type (HCC)  Course: Alyssa Blair is currently in the action stage of change. As such, her goal is to continue with weight loss efforts.   Nutrition goals: She has agreed to practicing portion control and making smarter food choices, such as increasing vegetables and decreasing simple carbohydrates.   Exercise goals: For substantial health benefits, adults should do at least 150 minutes (2 hours and 30 minutes) a week of moderate-intensity, or 75 minutes (1 hour and 15 minutes) a week of vigorous-intensity aerobic physical activity, or an equivalent combination of moderate- and vigorous-intensity aerobic activity. Aerobic activity should be performed in episodes of at least 10 minutes, and preferably, it should be spread throughout the week.  Behavioral modification strategies: increasing lean protein intake, decreasing simple carbohydrates, increasing vegetables, increasing water intake, and emotional eating strategies.  Alyssa Blair has agreed to follow-up with our clinic in 4 weeks. She was informed of the importance of frequent follow-up visits to maximize her success with intensive lifestyle modifications for her multiple health conditions.   Objective:   Blood pressure 125/75, pulse 66, temperature 98.2 F (36.8 C), temperature source Oral, height 5\' 6"  (  1.676 m), weight 184 lb (83.5 kg), SpO2 98 %. Body mass index is 29.7 kg/m.  General: Cooperative, alert, well developed, in no acute distress. HEENT: Conjunctivae and  lids unremarkable. Cardiovascular: Regular rhythm.  Lungs: Normal work of breathing. Neurologic: No focal deficits.   Lab Results  Component Value Date   CREATININE 0.8 07/02/2020   BUN 17 07/02/2020   NA 139 07/02/2020   K 3.9 07/02/2020   CL 102 07/02/2020   CO2 28 (A) 07/02/2020   Lab Results  Component Value Date   ALT 21 07/02/2020   AST 22 07/02/2020   ALKPHOS 132 (H) 03/09/2020   BILITOT 0.5 03/09/2020   Lab Results  Component Value Date   HGBA1C 5.4 03/09/2020   Lab Results  Component Value Date   INSULIN 16.8 03/09/2020   INSULIN 15.4 09/29/2019   Lab Results  Component Value Date   TSH 0.279 (L) 09/02/2020   Lab Results  Component Value Date   CHOL 172 07/02/2020   HDL 61 07/02/2020   LDLCALC 101 07/02/2020   TRIG 54 07/02/2020   Lab Results  Component Value Date   VD25OH 44.4 07/02/2020   VD25OH 60.8 03/09/2020   VD25OH 37.3 09/29/2019   Lab Results  Component Value Date   WBC 8.3 09/29/2019   HGB 13.4 09/29/2019   HCT 39.9 09/29/2019   MCV 91 09/29/2019   PLT 205 09/29/2019   Attestation Statements:   Reviewed by clinician on day of visit: allergies, medications, problem list, medical history, surgical history, family history, social history, and previous encounter notes.  I, Insurance claims handler, CMA, am acting as transcriptionist for Helane Rima, DO  I have reviewed the above documentation for accuracy and completeness, and I agree with the above. -  Helane Rima, DO, MS, FAAFP, DABOM - Family and Bariatric Medicine.

## 2020-12-16 DIAGNOSIS — R0981 Nasal congestion: Secondary | ICD-10-CM | POA: Diagnosis not present

## 2020-12-16 DIAGNOSIS — H9209 Otalgia, unspecified ear: Secondary | ICD-10-CM | POA: Diagnosis not present

## 2020-12-16 DIAGNOSIS — U071 COVID-19: Secondary | ICD-10-CM | POA: Diagnosis not present

## 2020-12-16 DIAGNOSIS — R051 Acute cough: Secondary | ICD-10-CM | POA: Diagnosis not present

## 2021-01-03 ENCOUNTER — Ambulatory Visit (INDEPENDENT_AMBULATORY_CARE_PROVIDER_SITE_OTHER): Payer: BC Managed Care – PPO | Admitting: Family Medicine

## 2021-01-03 ENCOUNTER — Other Ambulatory Visit: Payer: Self-pay

## 2021-01-03 ENCOUNTER — Encounter (INDEPENDENT_AMBULATORY_CARE_PROVIDER_SITE_OTHER): Payer: Self-pay | Admitting: Family Medicine

## 2021-01-03 VITALS — BP 152/77 | HR 61 | Temp 97.7°F | Ht 66.0 in | Wt 180.0 lb

## 2021-01-03 DIAGNOSIS — E038 Other specified hypothyroidism: Secondary | ICD-10-CM | POA: Diagnosis not present

## 2021-01-03 DIAGNOSIS — R632 Polyphagia: Secondary | ICD-10-CM | POA: Diagnosis not present

## 2021-01-03 DIAGNOSIS — F439 Reaction to severe stress, unspecified: Secondary | ICD-10-CM

## 2021-01-03 DIAGNOSIS — I10 Essential (primary) hypertension: Secondary | ICD-10-CM | POA: Diagnosis not present

## 2021-01-03 DIAGNOSIS — Z6835 Body mass index (BMI) 35.0-35.9, adult: Secondary | ICD-10-CM

## 2021-01-04 LAB — T4, FREE: Free T4: 1.52 ng/dL (ref 0.82–1.77)

## 2021-01-04 LAB — TSH: TSH: 0.435 u[IU]/mL — ABNORMAL LOW (ref 0.450–4.500)

## 2021-01-04 NOTE — Progress Notes (Signed)
Chief Complaint:   OBESITY Alyssa Blair is here to discuss her progress with her obesity treatment plan along with follow-up of her obesity related diagnoses. See Medical Weight Management Flowsheet for complete bioelectrical impedance results.  Today's visit was #: 20 Starting weight: 222 lbs Starting date: 09/29/2019 Weight change since last visit: 4 lbs Total lbs lost to date: 42 lbs Total weight loss percentage to date: -18.92%  Nutrition Plan: Practicing portion control and making smarter food choices, such as increasing vegetables and decreasing simple carbohydrates for 90-95% of the time. Activity: Increased activity. Anti-obesity medications: Wegovy 2.4 mg subcutaneously weekly. Reported side effects: None.  Interim History: Alyssa Blair has not had a visit with a therapist in several weeks.  She says that her son's ashes were given to her ex-husband, which she is okay with.  Her grandchildren have reached out to her.  She says she still has good and bad days.  Assessment/Plan:   1. Essential hypertension, benign Elevated today. Medications: HCTZ 12.5 mg daily.   Plan: Avoid buying foods that are: processed, frozen, or prepackaged to avoid excess salt. We will watch for signs of hypotension as she continues lifestyle modifications.  BP Readings from Last 3 Encounters:  01/03/21 (!) 152/77  11/29/20 125/75  11/02/20 125/66   Lab Results  Component Value Date   CREATININE 0.8 07/02/2020   2. Polyphagia Controlled. Current treatment: Wegovy 2.4 mg subcutaneously weekly.    Plan: She will continue to focus on protein-rich, low simple carbohydrate foods. We reviewed the importance of hydration, regular exercise for stress reduction, and restorative sleep.   3. Other specified hypothyroidism Medication: levothyroxine 112 mcg daily.   Plan: Patient was instructed not to take MVM or iron within 4 hours of taking thyroid medications.  We will continue to monitor alongside PCP as it  relates to her weight loss journey.  Check TSH and free T4 today.  May need to increase.  - TSH - T4, free  4. Situational stress Alyssa Blair is taking Effexor 75 mg twice daily.   Plan:  Behavior modification techniques were discussed today to help Alyssa Blair deal with her anxiety.   5. Obesity with current BMI 29.2  Course: Alyssa Blair is currently in the action stage of change. As such, her goal is to continue with weight loss efforts.   Nutrition goals: She has agreed to practicing portion control and making smarter food choices, such as increasing vegetables and decreasing simple carbohydrates.   Exercise goals:  As is.  Behavioral modification strategies: increasing lean protein intake, decreasing simple carbohydrates, increasing vegetables, increasing water intake, decreasing liquid calories, and decreasing alcohol intake.  Alyssa Blair has agreed to follow-up with our clinic in 4 weeks. She was informed of the importance of frequent follow-up visits to maximize her success with intensive lifestyle modifications for her multiple health conditions.   Objective:   Blood pressure (!) 152/77, pulse 61, temperature 97.7 F (36.5 C), temperature source Oral, height 5\' 6"  (1.676 m), weight 180 lb (81.6 kg), SpO2 96 %. Body mass index is 29.05 kg/m.  General: Cooperative, alert, well developed, in no acute distress. HEENT: Conjunctivae and lids unremarkable. Cardiovascular: Regular rhythm.  Lungs: Normal work of breathing. Neurologic: No focal deficits.   Lab Results  Component Value Date   CREATININE 0.8 07/02/2020   BUN 17 07/02/2020   NA 139 07/02/2020   K 3.9 07/02/2020   CL 102 07/02/2020   CO2 28 (A) 07/02/2020   Lab Results  Component Value Date  ALT 21 07/02/2020   AST 22 07/02/2020   ALKPHOS 132 (H) 03/09/2020   BILITOT 0.5 03/09/2020   Lab Results  Component Value Date   HGBA1C 5.4 03/09/2020   Lab Results  Component Value Date   INSULIN 16.8 03/09/2020   INSULIN 15.4  09/29/2019   Lab Results  Component Value Date   CHOL 172 07/02/2020   HDL 61 07/02/2020   LDLCALC 101 07/02/2020   TRIG 54 07/02/2020   Lab Results  Component Value Date   VD25OH 44.4 07/02/2020   VD25OH 60.8 03/09/2020   VD25OH 37.3 09/29/2019   Lab Results  Component Value Date   WBC 8.3 09/29/2019   HGB 13.4 09/29/2019   HCT 39.9 09/29/2019   MCV 91 09/29/2019   PLT 205 09/29/2019   Attestation Statements:   Reviewed by clinician on day of visit: allergies, medications, problem list, medical history, surgical history, family history, social history, and previous encounter notes.  I, Insurance claims handler, CMA, am acting as transcriptionist for Helane Rima, DO  I have reviewed the above documentation for accuracy and completeness, and I agree with the above. -  Helane Rima, DO, MS, FAAFP, DABOM - Family and Bariatric Medicine.

## 2021-01-14 DIAGNOSIS — D128 Benign neoplasm of rectum: Secondary | ICD-10-CM | POA: Diagnosis not present

## 2021-01-14 DIAGNOSIS — K529 Noninfective gastroenteritis and colitis, unspecified: Secondary | ICD-10-CM | POA: Diagnosis not present

## 2021-01-14 DIAGNOSIS — K6389 Other specified diseases of intestine: Secondary | ICD-10-CM | POA: Diagnosis not present

## 2021-01-14 DIAGNOSIS — Z86010 Personal history of colonic polyps: Secondary | ICD-10-CM | POA: Diagnosis not present

## 2021-02-16 ENCOUNTER — Encounter (INDEPENDENT_AMBULATORY_CARE_PROVIDER_SITE_OTHER): Payer: Self-pay | Admitting: Family Medicine

## 2021-02-16 ENCOUNTER — Ambulatory Visit (INDEPENDENT_AMBULATORY_CARE_PROVIDER_SITE_OTHER): Payer: BC Managed Care – PPO | Admitting: Family Medicine

## 2021-02-16 ENCOUNTER — Other Ambulatory Visit: Payer: Self-pay

## 2021-02-16 VITALS — BP 122/73 | HR 72 | Temp 97.8°F | Ht 66.0 in | Wt 179.0 lb

## 2021-02-16 DIAGNOSIS — R632 Polyphagia: Secondary | ICD-10-CM | POA: Diagnosis not present

## 2021-02-16 DIAGNOSIS — F439 Reaction to severe stress, unspecified: Secondary | ICD-10-CM

## 2021-02-16 DIAGNOSIS — F4323 Adjustment disorder with mixed anxiety and depressed mood: Secondary | ICD-10-CM | POA: Diagnosis not present

## 2021-02-16 DIAGNOSIS — E669 Obesity, unspecified: Secondary | ICD-10-CM | POA: Diagnosis not present

## 2021-02-16 DIAGNOSIS — Z6829 Body mass index (BMI) 29.0-29.9, adult: Secondary | ICD-10-CM

## 2021-02-17 ENCOUNTER — Encounter (INDEPENDENT_AMBULATORY_CARE_PROVIDER_SITE_OTHER): Payer: Self-pay | Admitting: Family Medicine

## 2021-02-17 MED ORDER — WEGOVY 1.7 MG/0.75ML ~~LOC~~ SOAJ: 1.7000 mg | SUBCUTANEOUS | 0 refills | Status: DC

## 2021-02-17 MED ORDER — WEGOVY 2.4 MG/0.75ML ~~LOC~~ SOAJ: 2.4000 mg | SUBCUTANEOUS | 0 refills | Status: DC

## 2021-02-22 NOTE — Progress Notes (Signed)
Chief Complaint:   OBESITY Alyssa Blair is here to discuss her progress with her obesity treatment plan along with follow-up of her obesity related diagnoses. See Medical Weight Management Flowsheet for complete bioelectrical impedance results.  Today's visit was #: 21 Starting weight: 222 lbs Starting date: 09/29/2019 Weight change since last visit: 1 lb Total lbs lost to date: 42 lbs Total weight loss percentage to date: -19.37%  Nutrition Plan: Practicing portion control and making smarter food choices, such as increasing vegetables and decreasing simple carbohydrates for 90% of the time.  Activity: None. Anti-obesity medications: Wegovy 2.4 mg subcutaneously weekly. Reported side effects: None.  Interim History: Alyssa Blair will be retiring soon and she says she is super excited.  She worked on her benefits/insurance.  She says she is worried that she will not be able to continue to obtain [facility].  Assessment/Plan:   1. Polyphagia Controlled. Current treatment: Wegovy 2.4 mg subcutaneously weekly.    Plan: She will continue to focus on protein-rich, low simple carbohydrate foods. We reviewed the importance of hydration, regular exercise for stress reduction, and restorative sleep.  - Refill Semaglutide-Weight Management (WEGOVY) 1.7 MG/0.75ML SOAJ; Inject 1.7 mg into the skin once a week.  Dispense: 9 mL; Refill: 0 - Refill Semaglutide-Weight Management (WEGOVY) 2.4 MG/0.75ML SOAJ; Inject 2.4 mg into the skin once a week.  Dispense: 9 mL; Refill: 0  2. Situational stress She says that work is incredibly hectic - retiring in April.  Her family dynamics are improving, however.  She says she is starting to have more good moments (after son's death).  3. Adjustment disorder with mixed anxiety and depressed mood Alyssa Blair is taking Effexor 75 mg twice daily and hydroxyzine 25 mg every 8 hours as needed. The current medical regimen is effective;  continue present plan and medications.  4. Obesity  with current BMI 29  Course: Alyssa Blair is currently in the action stage of change. As such, her goal is to continue with weight loss efforts.   Nutrition goals: She has agreed to practicing portion control and making smarter food choices, such as increasing vegetables and decreasing simple carbohydrates.   Exercise goals:  Increase NEAT.  Behavioral modification strategies: increasing lean protein intake, decreasing simple carbohydrates, increasing vegetables, and increasing water intake.  Alyssa Blair has agreed to follow-up with our clinic in 4 weeks. She was informed of the importance of frequent follow-up visits to maximize her success with intensive lifestyle modifications for her multiple health conditions.   Objective:   Blood pressure 122/73, pulse 72, temperature 97.8 F (36.6 C), temperature source Oral, height 5\' 6"  (1.676 m), weight 179 lb (81.2 kg), SpO2 95 %. Body mass index is 28.89 kg/m.  General: Cooperative, alert, well developed, in no acute distress. HEENT: Conjunctivae and lids unremarkable. Cardiovascular: Regular rhythm.  Lungs: Normal work of breathing. Neurologic: No focal deficits.   Lab Results  Component Value Date   CREATININE 0.8 07/02/2020   BUN 17 07/02/2020   NA 139 07/02/2020   K 3.9 07/02/2020   CL 102 07/02/2020   CO2 28 (A) 07/02/2020   Lab Results  Component Value Date   ALT 21 07/02/2020   AST 22 07/02/2020   ALKPHOS 132 (H) 03/09/2020   BILITOT 0.5 03/09/2020   Lab Results  Component Value Date   HGBA1C 5.4 03/09/2020   Lab Results  Component Value Date   INSULIN 16.8 03/09/2020   INSULIN 15.4 09/29/2019   Lab Results  Component Value Date  TSH 0.435 (L) 01/03/2021   Lab Results  Component Value Date   CHOL 172 07/02/2020   HDL 61 07/02/2020   LDLCALC 101 07/02/2020   TRIG 54 07/02/2020   Lab Results  Component Value Date   VD25OH 44.4 07/02/2020   VD25OH 60.8 03/09/2020   VD25OH 37.3 09/29/2019   Lab Results  Component  Value Date   WBC 8.3 09/29/2019   HGB 13.4 09/29/2019   HCT 39.9 09/29/2019   MCV 91 09/29/2019   PLT 205 09/29/2019   Attestation Statements:   Reviewed by clinician on day of visit: allergies, medications, problem list, medical history, surgical history, family history, social history, and previous encounter notes.  I, Insurance claims handler, CMA, am acting as transcriptionist for Helane Rima, DO  I have reviewed the above documentation for accuracy and completeness, and I agree with the above. -  Helane Rima, DO, MS, FAAFP, DABOM - Family and Bariatric Medicine.

## 2021-03-18 DIAGNOSIS — H35362 Drusen (degenerative) of macula, left eye: Secondary | ICD-10-CM | POA: Diagnosis not present

## 2021-03-23 DIAGNOSIS — M7581 Other shoulder lesions, right shoulder: Secondary | ICD-10-CM | POA: Diagnosis not present

## 2021-03-31 ENCOUNTER — Ambulatory Visit (INDEPENDENT_AMBULATORY_CARE_PROVIDER_SITE_OTHER): Payer: BC Managed Care – PPO | Admitting: Family Medicine

## 2021-03-31 ENCOUNTER — Encounter (INDEPENDENT_AMBULATORY_CARE_PROVIDER_SITE_OTHER): Payer: Self-pay

## 2021-04-04 ENCOUNTER — Other Ambulatory Visit: Payer: Self-pay

## 2021-04-04 ENCOUNTER — Ambulatory Visit (INDEPENDENT_AMBULATORY_CARE_PROVIDER_SITE_OTHER): Payer: BC Managed Care – PPO | Admitting: Family Medicine

## 2021-04-04 ENCOUNTER — Encounter (INDEPENDENT_AMBULATORY_CARE_PROVIDER_SITE_OTHER): Payer: Self-pay | Admitting: Family Medicine

## 2021-04-04 VITALS — BP 117/66 | HR 56 | Temp 98.2°F | Ht 66.0 in | Wt 176.0 lb

## 2021-04-04 DIAGNOSIS — Z6835 Body mass index (BMI) 35.0-35.9, adult: Secondary | ICD-10-CM

## 2021-04-04 DIAGNOSIS — F4323 Adjustment disorder with mixed anxiety and depressed mood: Secondary | ICD-10-CM

## 2021-04-04 DIAGNOSIS — E669 Obesity, unspecified: Secondary | ICD-10-CM

## 2021-04-04 DIAGNOSIS — E039 Hypothyroidism, unspecified: Secondary | ICD-10-CM

## 2021-04-04 DIAGNOSIS — R632 Polyphagia: Secondary | ICD-10-CM | POA: Diagnosis not present

## 2021-04-04 DIAGNOSIS — Z6828 Body mass index (BMI) 28.0-28.9, adult: Secondary | ICD-10-CM

## 2021-04-04 MED ORDER — WEGOVY 2.4 MG/0.75ML ~~LOC~~ SOAJ: 2.4000 mg | SUBCUTANEOUS | 0 refills | Status: DC

## 2021-04-06 IMAGING — DX DG CHEST 2V
2 series · 2 of 2 positions shown · non-contrast
Comparison: None.

CLINICAL DATA: Cough

EXAM:
CHEST - 2 VIEW

[dg chest 2 view (1 of 2)]
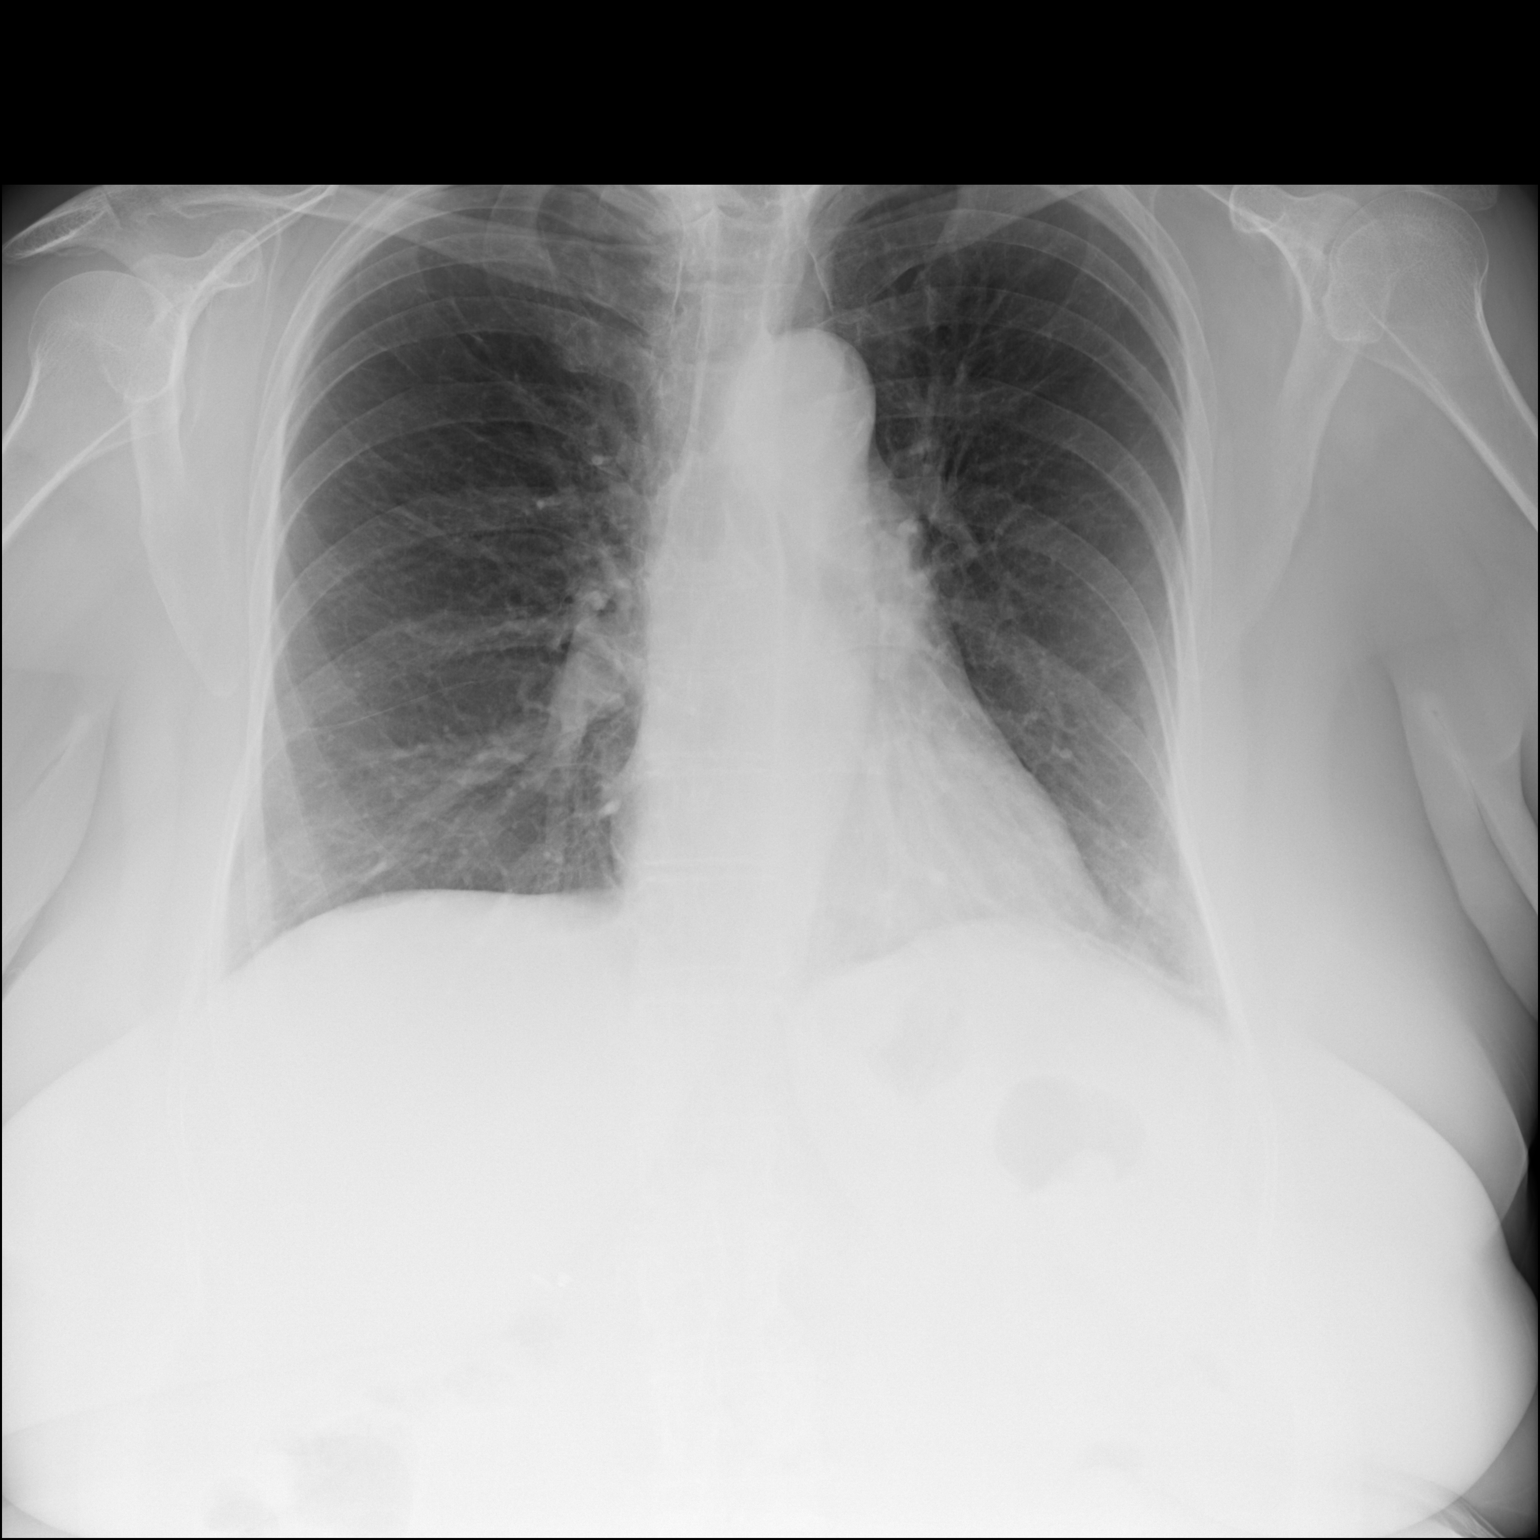

[dg chest 2 view (2 of 2)]
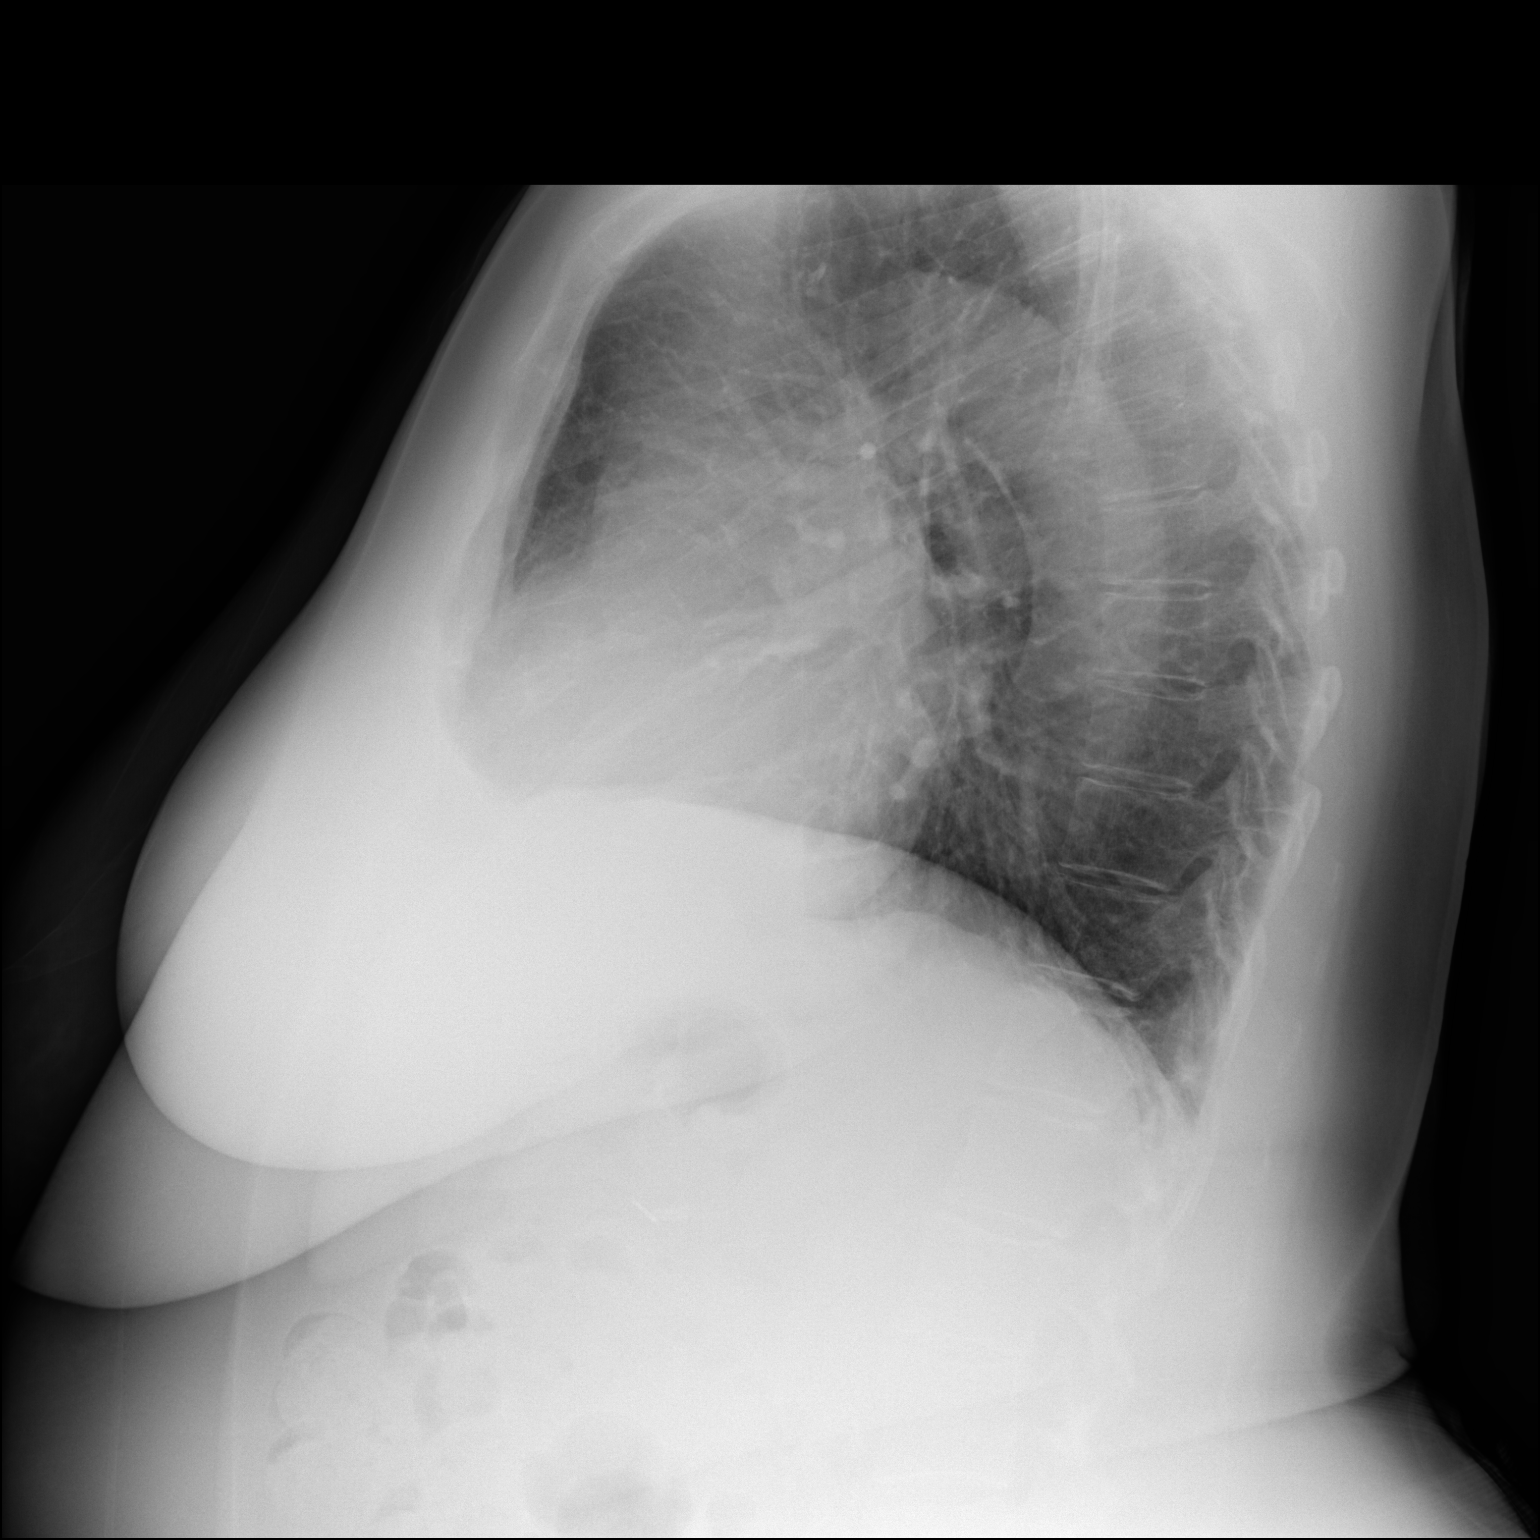

[2 of 2 positions shown; findings below may reference images not displayed]

FINDINGS: The heart size and mediastinal contours are within normal limits.
Both lungs are clear. The visualized skeletal structures are
unremarkable.
IMPRESSION: No acute abnormality of the lungs.

## 2021-04-11 NOTE — Progress Notes (Signed)
Chief Complaint:   OBESITY Alyssa Blair is here to discuss her progress with her obesity treatment plan along with follow-up of her obesity related diagnoses. See Medical Weight Management Flowsheet for complete bioelectrical impedance results.  Today's visit was #: 22 Starting weight: 222 lbs Starting date: 09/29/2019 Weight change since last visit: 3 lbs Total lbs lost to date: 46 lbs Total weight loss percentage to date: -20.72%  Nutrition Plan: Practicing portion control and making smarter food choices, such as increasing vegetables and decreasing simple carbohydrates for 100% of the time. Activity: Walking 6000-7000 steps 7 days per week. Anti-obesity medications: Wegovy 2.4 mg subcutaneously weekly. Reported side effects: None.  Interim History: Alyssa Blair is down 60 pounds today!  She will be retiring in April and continues to count down the days.   Assessment/Plan:   1. Polyphagia Controlled. Current treatment: Wegovy 2.4 mg subcutaneously weekly.    Plan: Continue Wegovy 2.4 mg subcutaneously weekly.  Will refill today.  She will continue to focus on protein-rich, low simple carbohydrate foods. We reviewed the importance of hydration, regular exercise for stress reduction, and restorative sleep.  - Refill Semaglutide-Weight Management (WEGOVY) 2.4 MG/0.75ML SOAJ; Inject 2.4 mg into the skin once a week.  Dispense: 9 mL; Refill: 0  2. Acquired hypothyroidism Medication: levothyroxine 112 mcg daily.   Plan: Patient was instructed not to take MVM or iron within 4 hours of taking thyroid medications.  We will continue to monitor alongside PCP as it relates to her weight loss journey.   Lab Results  Component Value Date   TSH 0.435 (L) 01/03/2021   3. Adjustment disorder with mixed anxiety and depressed mood Alyssa Blair is taking Effexor 75 mg twice daily and hydroxyzine 25 mg every 8 hours as needed. The current medical regimen is effective;  continue present plan and medications.  Provided psychoeducation and facilitated discussion regarding eating as habit, self-monitoring, stimulus control, regulating eating pattern, coping skills, and barriers to success.   4. Obesity with current BMI 28.5  Course: Alyssa Blair is currently in the action stage of change. As such, her goal is to continue with weight loss efforts.   Nutrition goals: She has agreed to practicing portion control and making smarter food choices, such as increasing vegetables and decreasing simple carbohydrates.   Exercise goals:  As is.  Behavioral modification strategies: increasing lean protein intake, decreasing simple carbohydrates, increasing vegetables, increasing water intake, and decreasing liquid calories.  Alyssa Blair has agreed to follow-up with our clinic in 4 weeks. She was informed of the importance of frequent follow-up visits to maximize her success with intensive lifestyle modifications for her multiple health conditions.   Objective:   Blood pressure 117/66, pulse (!) 56, temperature 98.2 F (36.8 C), temperature source Oral, height 5\' 6"  (1.676 m), weight 176 lb (79.8 kg), SpO2 98 %. Body mass index is 28.41 kg/m.  General: Cooperative, alert, well developed, in no acute distress. HEENT: Conjunctivae and lids unremarkable. Cardiovascular: Regular rhythm.  Lungs: Normal work of breathing. Neurologic: No focal deficits.   Lab Results  Component Value Date   CREATININE 0.8 07/02/2020   BUN 17 07/02/2020   NA 139 07/02/2020   K 3.9 07/02/2020   CL 102 07/02/2020   CO2 28 (A) 07/02/2020   Lab Results  Component Value Date   ALT 21 07/02/2020   AST 22 07/02/2020   ALKPHOS 132 (H) 03/09/2020   BILITOT 0.5 03/09/2020   Lab Results  Component Value Date   HGBA1C 5.4 03/09/2020  Lab Results  Component Value Date   INSULIN 16.8 03/09/2020   INSULIN 15.4 09/29/2019   Lab Results  Component Value Date   TSH 0.435 (L) 01/03/2021   Lab Results  Component Value Date   CHOL 172  07/02/2020   HDL 61 07/02/2020   LDLCALC 101 07/02/2020   TRIG 54 07/02/2020   Lab Results  Component Value Date   VD25OH 44.4 07/02/2020   VD25OH 60.8 03/09/2020   VD25OH 37.3 09/29/2019   Lab Results  Component Value Date   WBC 8.3 09/29/2019   HGB 13.4 09/29/2019   HCT 39.9 09/29/2019   MCV 91 09/29/2019   PLT 205 09/29/2019   Attestation Statements:   Reviewed by clinician on day of visit: allergies, medications, problem list, medical history, surgical history, family history, social history, and previous encounter notes.  I, Insurance claims handler, CMA, am acting as transcriptionist for Helane Rima, DO  I have reviewed the above documentation for accuracy and completeness, and I agree with the above. -  Helane Rima, DO, MS, FAAFP, DABOM - Family and Bariatric Medicine.

## 2021-05-03 ENCOUNTER — Ambulatory Visit (INDEPENDENT_AMBULATORY_CARE_PROVIDER_SITE_OTHER): Payer: BC Managed Care – PPO | Admitting: Family Medicine

## 2021-05-03 ENCOUNTER — Encounter (INDEPENDENT_AMBULATORY_CARE_PROVIDER_SITE_OTHER): Payer: Self-pay | Admitting: Family Medicine

## 2021-05-03 VITALS — BP 135/68 | HR 63 | Temp 97.8°F | Ht 66.0 in | Wt 179.0 lb

## 2021-05-03 DIAGNOSIS — E669 Obesity, unspecified: Secondary | ICD-10-CM | POA: Diagnosis not present

## 2021-05-03 DIAGNOSIS — F4323 Adjustment disorder with mixed anxiety and depressed mood: Secondary | ICD-10-CM

## 2021-05-03 DIAGNOSIS — E039 Hypothyroidism, unspecified: Secondary | ICD-10-CM | POA: Diagnosis not present

## 2021-05-03 DIAGNOSIS — R632 Polyphagia: Secondary | ICD-10-CM

## 2021-05-03 DIAGNOSIS — Z6828 Body mass index (BMI) 28.0-28.9, adult: Secondary | ICD-10-CM

## 2021-05-03 MED ORDER — WEGOVY 2.4 MG/0.75ML ~~LOC~~ SOAJ: 2.4000 mg | SUBCUTANEOUS | 0 refills | Status: AC

## 2021-05-09 NOTE — Progress Notes (Signed)
Chief Complaint:   OBESITY Alyssa Blair is here to discuss her progress with her obesity treatment plan along with follow-up of her obesity related diagnoses. See Medical Weight Management Flowsheet for complete bioelectrical impedance results.  Today's visit was #: 23 Starting weight: 222 lbs Starting date: 09/29/2019 Weight change since last visit: +3 lbs Total lbs lost to date: 43 lbs Total weight loss percentage to date: -19.37%  Nutrition Plan: Practicing portion control and making smarter food choices, such as increasing vegetables and decreasing simple carbohydrates for 100% of the time. Activity: Increased walking. Anti-obesity medications: Wegovy 2.4 mg subcutaneously weekly. Reported side effects: None.  Interim History: Alyssa Blair says she has 18 working days left before retirement.  She has gone from a size 20 to a size 12 in pants!  She says that her weight is up due to her brother and sister-in-law staying with them.  Sister-in-law likes to cook sweets.  Assessment/Plan:   1. Polyphagia Controlled. Current treatment: Wegovy 2.4 mg subcutaneously weekly.    Plan:  Continue Wegovy 2.4 mg subcutaneously weekly, as per below. She will continue to focus on protein-rich, low simple carbohydrate foods. We reviewed the importance of hydration, regular exercise for stress reduction, and restorative sleep.  - Refill Semaglutide-Weight Management (WEGOVY) 2.4 MG/0.75ML SOAJ; Inject 2.4 mg into the skin once a week.  Dispense: 9 mL; Refill: 0  2. Acquired hypothyroidism Medication: levothyroxine 112 mcg daily.   Plan: Patient was instructed not to take MVM or iron within 4 hours of taking thyroid medications. We will continue to monitor alongside Endocrinology/PCP as it relates to her weight loss journey.   Lab Results  Component Value Date   TSH 0.435 (L) 01/03/2021    3. Adjustment disorder with mixed anxiety and depressed mood Majerle is taking Effexor 75 mg twice daily and  hydroxyzine 25 mg every 8 hours as needed. The current medical regimen is effective;  continue present plan and medications. Provided psychoeducation and facilitated discussion regarding eating as habit, self-monitoring, stimulus control, regulating eating pattern, coping skills, and barriers to success.   4. Obesity with current BMI 28.9  Course: Alyssa Blair is currently in the action stage of change. As such, her goal is to continue with weight loss efforts.   Nutrition goals: She has agreed to practicing portion control and making smarter food choices, such as increasing vegetables and decreasing simple carbohydrates.   Exercise goals:  As is.  Behavioral modification strategies: increasing lean protein intake, decreasing simple carbohydrates, increasing vegetables, and increasing water intake.  Alyssa Blair has agreed to follow-up with our clinic in 6 weeks. She was informed of the importance of frequent follow-up visits to maximize her success with intensive lifestyle modifications for her multiple health conditions.   Objective:   Blood pressure 135/68, pulse 63, temperature 97.8 F (36.6 C), temperature source Oral, height 5\' 6"  (1.676 m), weight 179 lb (81.2 kg), SpO2 96 %. Body mass index is 28.89 kg/m.  General: Cooperative, alert, well developed, in no acute distress. HEENT: Conjunctivae and lids unremarkable. Cardiovascular: Regular rhythm.  Lungs: Normal work of breathing. Neurologic: No focal deficits.   Lab Results  Component Value Date   CREATININE 0.8 07/02/2020   BUN 17 07/02/2020   NA 139 07/02/2020   K 3.9 07/02/2020   CL 102 07/02/2020   CO2 28 (A) 07/02/2020   Lab Results  Component Value Date   ALT 21 07/02/2020   AST 22 07/02/2020   ALKPHOS 132 (H) 03/09/2020  BILITOT 0.5 03/09/2020   Lab Results  Component Value Date   HGBA1C 5.4 03/09/2020   Lab Results  Component Value Date   INSULIN 16.8 03/09/2020   INSULIN 15.4 09/29/2019   Lab Results  Component  Value Date   TSH 0.435 (L) 01/03/2021   Lab Results  Component Value Date   CHOL 172 07/02/2020   HDL 61 07/02/2020   LDLCALC 101 07/02/2020   TRIG 54 07/02/2020   Lab Results  Component Value Date   VD25OH 44.4 07/02/2020   VD25OH 60.8 03/09/2020   VD25OH 37.3 09/29/2019   Lab Results  Component Value Date   WBC 8.3 09/29/2019   HGB 13.4 09/29/2019   HCT 39.9 09/29/2019   MCV 91 09/29/2019   PLT 205 09/29/2019   Attestation Statements:   Reviewed by clinician on day of visit: allergies, medications, problem list, medical history, surgical history, family history, social history, and previous encounter notes.  I, Water quality scientist, CMA, am acting as transcriptionist for Briscoe Deutscher, DO  I have reviewed the above documentation for accuracy and completeness, and I agree with the above. -  Briscoe Deutscher, DO, MS, FAAFP, DABOM - Family and Bariatric Medicine.

## 2021-06-13 DIAGNOSIS — Z8639 Personal history of other endocrine, nutritional and metabolic disease: Secondary | ICD-10-CM | POA: Diagnosis not present

## 2021-06-13 DIAGNOSIS — E663 Overweight: Secondary | ICD-10-CM | POA: Diagnosis not present

## 2021-06-13 DIAGNOSIS — R632 Polyphagia: Secondary | ICD-10-CM | POA: Diagnosis not present

## 2021-06-13 DIAGNOSIS — F410 Panic disorder [episodic paroxysmal anxiety] without agoraphobia: Secondary | ICD-10-CM | POA: Diagnosis not present

## 2021-07-12 DIAGNOSIS — R632 Polyphagia: Secondary | ICD-10-CM | POA: Diagnosis not present

## 2021-07-12 DIAGNOSIS — F4321 Adjustment disorder with depressed mood: Secondary | ICD-10-CM | POA: Diagnosis not present

## 2021-07-12 DIAGNOSIS — F410 Panic disorder [episodic paroxysmal anxiety] without agoraphobia: Secondary | ICD-10-CM | POA: Diagnosis not present

## 2021-07-12 DIAGNOSIS — Z8639 Personal history of other endocrine, nutritional and metabolic disease: Secondary | ICD-10-CM | POA: Diagnosis not present

## 2021-07-18 DIAGNOSIS — Z1159 Encounter for screening for other viral diseases: Secondary | ICD-10-CM | POA: Diagnosis not present

## 2021-07-18 DIAGNOSIS — Z0000 Encounter for general adult medical examination without abnormal findings: Secondary | ICD-10-CM | POA: Diagnosis not present

## 2021-07-18 DIAGNOSIS — I10 Essential (primary) hypertension: Secondary | ICD-10-CM | POA: Diagnosis not present

## 2021-07-18 DIAGNOSIS — E039 Hypothyroidism, unspecified: Secondary | ICD-10-CM | POA: Diagnosis not present

## 2021-07-18 DIAGNOSIS — E7800 Pure hypercholesterolemia, unspecified: Secondary | ICD-10-CM | POA: Diagnosis not present

## 2021-07-18 DIAGNOSIS — E559 Vitamin D deficiency, unspecified: Secondary | ICD-10-CM | POA: Diagnosis not present

## 2021-07-18 DIAGNOSIS — F4321 Adjustment disorder with depressed mood: Secondary | ICD-10-CM | POA: Diagnosis not present

## 2021-08-30 DIAGNOSIS — R632 Polyphagia: Secondary | ICD-10-CM | POA: Diagnosis not present

## 2021-08-30 DIAGNOSIS — I10 Essential (primary) hypertension: Secondary | ICD-10-CM | POA: Diagnosis not present

## 2021-08-30 DIAGNOSIS — F410 Panic disorder [episodic paroxysmal anxiety] without agoraphobia: Secondary | ICD-10-CM | POA: Diagnosis not present

## 2021-08-30 DIAGNOSIS — F4321 Adjustment disorder with depressed mood: Secondary | ICD-10-CM | POA: Diagnosis not present

## 2021-09-07 ENCOUNTER — Encounter (INDEPENDENT_AMBULATORY_CARE_PROVIDER_SITE_OTHER): Payer: Self-pay

## 2021-10-04 DIAGNOSIS — F4321 Adjustment disorder with depressed mood: Secondary | ICD-10-CM | POA: Diagnosis not present

## 2021-10-04 DIAGNOSIS — R632 Polyphagia: Secondary | ICD-10-CM | POA: Diagnosis not present

## 2021-10-04 DIAGNOSIS — E663 Overweight: Secondary | ICD-10-CM | POA: Diagnosis not present

## 2021-10-04 DIAGNOSIS — I10 Essential (primary) hypertension: Secondary | ICD-10-CM | POA: Diagnosis not present

## 2021-10-18 DIAGNOSIS — M7581 Other shoulder lesions, right shoulder: Secondary | ICD-10-CM | POA: Diagnosis not present

## 2021-11-02 DIAGNOSIS — F4321 Adjustment disorder with depressed mood: Secondary | ICD-10-CM | POA: Diagnosis not present

## 2021-11-02 DIAGNOSIS — R632 Polyphagia: Secondary | ICD-10-CM | POA: Diagnosis not present

## 2021-11-02 DIAGNOSIS — I10 Essential (primary) hypertension: Secondary | ICD-10-CM | POA: Diagnosis not present

## 2021-11-02 DIAGNOSIS — E663 Overweight: Secondary | ICD-10-CM | POA: Diagnosis not present

## 2021-12-06 DIAGNOSIS — H5203 Hypermetropia, bilateral: Secondary | ICD-10-CM | POA: Diagnosis not present

## 2021-12-07 DIAGNOSIS — F410 Panic disorder [episodic paroxysmal anxiety] without agoraphobia: Secondary | ICD-10-CM | POA: Diagnosis not present

## 2021-12-07 DIAGNOSIS — I10 Essential (primary) hypertension: Secondary | ICD-10-CM | POA: Diagnosis not present

## 2021-12-07 DIAGNOSIS — Z8639 Personal history of other endocrine, nutritional and metabolic disease: Secondary | ICD-10-CM | POA: Diagnosis not present

## 2021-12-07 DIAGNOSIS — R632 Polyphagia: Secondary | ICD-10-CM | POA: Diagnosis not present

## 2022-01-16 DIAGNOSIS — R632 Polyphagia: Secondary | ICD-10-CM | POA: Diagnosis not present

## 2022-01-16 DIAGNOSIS — I10 Essential (primary) hypertension: Secondary | ICD-10-CM | POA: Diagnosis not present

## 2022-01-16 DIAGNOSIS — E663 Overweight: Secondary | ICD-10-CM | POA: Diagnosis not present

## 2022-01-16 DIAGNOSIS — Z6827 Body mass index (BMI) 27.0-27.9, adult: Secondary | ICD-10-CM | POA: Diagnosis not present

## 2022-02-28 DIAGNOSIS — K080 Exfoliation of teeth due to systemic causes: Secondary | ICD-10-CM | POA: Diagnosis not present

## 2022-03-01 DIAGNOSIS — F4329 Adjustment disorder with other symptoms: Secondary | ICD-10-CM | POA: Diagnosis not present

## 2022-03-01 DIAGNOSIS — K080 Exfoliation of teeth due to systemic causes: Secondary | ICD-10-CM | POA: Diagnosis not present

## 2022-03-01 DIAGNOSIS — E663 Overweight: Secondary | ICD-10-CM | POA: Diagnosis not present

## 2022-03-01 DIAGNOSIS — I10 Essential (primary) hypertension: Secondary | ICD-10-CM | POA: Diagnosis not present

## 2022-03-01 DIAGNOSIS — R632 Polyphagia: Secondary | ICD-10-CM | POA: Diagnosis not present

## 2022-03-30 DIAGNOSIS — K080 Exfoliation of teeth due to systemic causes: Secondary | ICD-10-CM | POA: Diagnosis not present

## 2022-04-18 DIAGNOSIS — E039 Hypothyroidism, unspecified: Secondary | ICD-10-CM | POA: Diagnosis not present

## 2022-04-18 DIAGNOSIS — E7800 Pure hypercholesterolemia, unspecified: Secondary | ICD-10-CM | POA: Diagnosis not present

## 2022-04-18 DIAGNOSIS — R7301 Impaired fasting glucose: Secondary | ICD-10-CM | POA: Diagnosis not present

## 2022-04-18 DIAGNOSIS — B9689 Other specified bacterial agents as the cause of diseases classified elsewhere: Secondary | ICD-10-CM | POA: Diagnosis not present

## 2022-04-18 DIAGNOSIS — I7090 Unspecified atherosclerosis: Secondary | ICD-10-CM | POA: Diagnosis not present

## 2022-05-15 DIAGNOSIS — I7090 Unspecified atherosclerosis: Secondary | ICD-10-CM | POA: Diagnosis not present

## 2022-05-15 DIAGNOSIS — M25511 Pain in right shoulder: Secondary | ICD-10-CM | POA: Diagnosis not present

## 2022-05-15 DIAGNOSIS — E039 Hypothyroidism, unspecified: Secondary | ICD-10-CM | POA: Diagnosis not present

## 2022-05-15 DIAGNOSIS — Z8639 Personal history of other endocrine, nutritional and metabolic disease: Secondary | ICD-10-CM | POA: Diagnosis not present

## 2022-05-16 DIAGNOSIS — M7581 Other shoulder lesions, right shoulder: Secondary | ICD-10-CM | POA: Diagnosis not present

## 2022-06-11 DIAGNOSIS — S93602A Unspecified sprain of left foot, initial encounter: Secondary | ICD-10-CM | POA: Diagnosis not present

## 2022-06-11 DIAGNOSIS — M79672 Pain in left foot: Secondary | ICD-10-CM | POA: Diagnosis not present

## 2022-06-12 DIAGNOSIS — R7301 Impaired fasting glucose: Secondary | ICD-10-CM | POA: Diagnosis not present

## 2022-06-12 DIAGNOSIS — Z8639 Personal history of other endocrine, nutritional and metabolic disease: Secondary | ICD-10-CM | POA: Diagnosis not present

## 2022-06-12 DIAGNOSIS — M25572 Pain in left ankle and joints of left foot: Secondary | ICD-10-CM | POA: Diagnosis not present

## 2022-06-12 DIAGNOSIS — I7090 Unspecified atherosclerosis: Secondary | ICD-10-CM | POA: Diagnosis not present

## 2022-06-23 DIAGNOSIS — M25572 Pain in left ankle and joints of left foot: Secondary | ICD-10-CM | POA: Diagnosis not present

## 2022-07-13 DIAGNOSIS — I7090 Unspecified atherosclerosis: Secondary | ICD-10-CM | POA: Diagnosis not present

## 2022-07-13 DIAGNOSIS — E039 Hypothyroidism, unspecified: Secondary | ICD-10-CM | POA: Diagnosis not present

## 2022-07-13 DIAGNOSIS — R632 Polyphagia: Secondary | ICD-10-CM | POA: Diagnosis not present

## 2022-08-10 DIAGNOSIS — I7090 Unspecified atherosclerosis: Secondary | ICD-10-CM | POA: Diagnosis not present

## 2022-08-10 DIAGNOSIS — Z9189 Other specified personal risk factors, not elsewhere classified: Secondary | ICD-10-CM | POA: Diagnosis not present

## 2022-08-10 DIAGNOSIS — R632 Polyphagia: Secondary | ICD-10-CM | POA: Diagnosis not present

## 2022-08-22 DIAGNOSIS — E7800 Pure hypercholesterolemia, unspecified: Secondary | ICD-10-CM | POA: Diagnosis not present

## 2022-08-22 DIAGNOSIS — E039 Hypothyroidism, unspecified: Secondary | ICD-10-CM | POA: Diagnosis not present

## 2022-08-22 DIAGNOSIS — F4321 Adjustment disorder with depressed mood: Secondary | ICD-10-CM | POA: Diagnosis not present

## 2022-08-22 DIAGNOSIS — D692 Other nonthrombocytopenic purpura: Secondary | ICD-10-CM | POA: Diagnosis not present

## 2022-08-22 DIAGNOSIS — Z0000 Encounter for general adult medical examination without abnormal findings: Secondary | ICD-10-CM | POA: Diagnosis not present

## 2022-08-22 DIAGNOSIS — Z131 Encounter for screening for diabetes mellitus: Secondary | ICD-10-CM | POA: Diagnosis not present

## 2022-08-22 DIAGNOSIS — I10 Essential (primary) hypertension: Secondary | ICD-10-CM | POA: Diagnosis not present

## 2022-08-28 DIAGNOSIS — R92323 Mammographic fibroglandular density, bilateral breasts: Secondary | ICD-10-CM | POA: Diagnosis not present

## 2022-08-28 DIAGNOSIS — M8589 Other specified disorders of bone density and structure, multiple sites: Secondary | ICD-10-CM | POA: Diagnosis not present

## 2022-08-28 DIAGNOSIS — M85852 Other specified disorders of bone density and structure, left thigh: Secondary | ICD-10-CM | POA: Diagnosis not present

## 2022-08-28 DIAGNOSIS — Z1231 Encounter for screening mammogram for malignant neoplasm of breast: Secondary | ICD-10-CM | POA: Diagnosis not present

## 2022-09-07 DIAGNOSIS — F419 Anxiety disorder, unspecified: Secondary | ICD-10-CM | POA: Diagnosis not present

## 2022-09-07 DIAGNOSIS — R632 Polyphagia: Secondary | ICD-10-CM | POA: Diagnosis not present

## 2022-09-07 DIAGNOSIS — I7090 Unspecified atherosclerosis: Secondary | ICD-10-CM | POA: Diagnosis not present

## 2022-09-07 DIAGNOSIS — E663 Overweight: Secondary | ICD-10-CM | POA: Diagnosis not present

## 2022-09-07 DIAGNOSIS — K080 Exfoliation of teeth due to systemic causes: Secondary | ICD-10-CM | POA: Diagnosis not present

## 2022-09-26 DIAGNOSIS — J0140 Acute pansinusitis, unspecified: Secondary | ICD-10-CM | POA: Diagnosis not present

## 2022-09-26 DIAGNOSIS — M62830 Muscle spasm of back: Secondary | ICD-10-CM | POA: Diagnosis not present

## 2022-10-05 DIAGNOSIS — F419 Anxiety disorder, unspecified: Secondary | ICD-10-CM | POA: Diagnosis not present

## 2022-10-05 DIAGNOSIS — Z8639 Personal history of other endocrine, nutritional and metabolic disease: Secondary | ICD-10-CM | POA: Diagnosis not present

## 2022-10-05 DIAGNOSIS — R632 Polyphagia: Secondary | ICD-10-CM | POA: Diagnosis not present

## 2022-10-05 DIAGNOSIS — I7090 Unspecified atherosclerosis: Secondary | ICD-10-CM | POA: Diagnosis not present

## 2022-10-11 DIAGNOSIS — D72829 Elevated white blood cell count, unspecified: Secondary | ICD-10-CM | POA: Diagnosis not present

## 2022-10-11 DIAGNOSIS — N281 Cyst of kidney, acquired: Secondary | ICD-10-CM | POA: Diagnosis not present

## 2022-10-11 DIAGNOSIS — Z9049 Acquired absence of other specified parts of digestive tract: Secondary | ICD-10-CM | POA: Diagnosis not present

## 2022-10-11 DIAGNOSIS — R112 Nausea with vomiting, unspecified: Secondary | ICD-10-CM | POA: Diagnosis not present

## 2022-10-11 DIAGNOSIS — R109 Unspecified abdominal pain: Secondary | ICD-10-CM | POA: Diagnosis not present

## 2022-10-11 DIAGNOSIS — R1031 Right lower quadrant pain: Secondary | ICD-10-CM | POA: Diagnosis not present

## 2022-10-11 DIAGNOSIS — I700 Atherosclerosis of aorta: Secondary | ICD-10-CM | POA: Diagnosis not present

## 2022-10-14 DIAGNOSIS — R1031 Right lower quadrant pain: Secondary | ICD-10-CM | POA: Diagnosis not present

## 2022-10-14 DIAGNOSIS — K529 Noninfective gastroenteritis and colitis, unspecified: Secondary | ICD-10-CM | POA: Diagnosis not present

## 2022-10-17 DIAGNOSIS — R1031 Right lower quadrant pain: Secondary | ICD-10-CM | POA: Diagnosis not present

## 2022-11-02 DIAGNOSIS — Z8639 Personal history of other endocrine, nutritional and metabolic disease: Secondary | ICD-10-CM | POA: Diagnosis not present

## 2022-11-02 DIAGNOSIS — R632 Polyphagia: Secondary | ICD-10-CM | POA: Diagnosis not present

## 2022-11-02 DIAGNOSIS — F411 Generalized anxiety disorder: Secondary | ICD-10-CM | POA: Diagnosis not present

## 2022-11-02 DIAGNOSIS — R1084 Generalized abdominal pain: Secondary | ICD-10-CM | POA: Diagnosis not present

## 2022-12-04 DIAGNOSIS — Z8639 Personal history of other endocrine, nutritional and metabolic disease: Secondary | ICD-10-CM | POA: Diagnosis not present

## 2022-12-04 DIAGNOSIS — E039 Hypothyroidism, unspecified: Secondary | ICD-10-CM | POA: Diagnosis not present

## 2022-12-04 DIAGNOSIS — R632 Polyphagia: Secondary | ICD-10-CM | POA: Diagnosis not present

## 2022-12-04 DIAGNOSIS — F411 Generalized anxiety disorder: Secondary | ICD-10-CM | POA: Diagnosis not present

## 2023-01-01 DIAGNOSIS — R632 Polyphagia: Secondary | ICD-10-CM | POA: Diagnosis not present

## 2023-01-01 DIAGNOSIS — E663 Overweight: Secondary | ICD-10-CM | POA: Diagnosis not present

## 2023-01-01 DIAGNOSIS — Z8639 Personal history of other endocrine, nutritional and metabolic disease: Secondary | ICD-10-CM | POA: Diagnosis not present

## 2023-01-01 DIAGNOSIS — E039 Hypothyroidism, unspecified: Secondary | ICD-10-CM | POA: Diagnosis not present

## 2023-02-06 DIAGNOSIS — M542 Cervicalgia: Secondary | ICD-10-CM | POA: Diagnosis not present

## 2023-02-06 DIAGNOSIS — M25511 Pain in right shoulder: Secondary | ICD-10-CM | POA: Diagnosis not present

## 2023-02-09 DIAGNOSIS — Z8639 Personal history of other endocrine, nutritional and metabolic disease: Secondary | ICD-10-CM | POA: Diagnosis not present

## 2023-02-09 DIAGNOSIS — R632 Polyphagia: Secondary | ICD-10-CM | POA: Diagnosis not present

## 2023-02-09 DIAGNOSIS — F411 Generalized anxiety disorder: Secondary | ICD-10-CM | POA: Diagnosis not present

## 2023-02-09 DIAGNOSIS — M8580 Other specified disorders of bone density and structure, unspecified site: Secondary | ICD-10-CM | POA: Diagnosis not present

## 2023-02-19 DIAGNOSIS — R051 Acute cough: Secondary | ICD-10-CM | POA: Diagnosis not present

## 2023-02-19 DIAGNOSIS — R490 Dysphonia: Secondary | ICD-10-CM | POA: Diagnosis not present

## 2023-02-19 DIAGNOSIS — J180 Bronchopneumonia, unspecified organism: Secondary | ICD-10-CM | POA: Diagnosis not present

## 2023-02-28 DIAGNOSIS — R051 Acute cough: Secondary | ICD-10-CM | POA: Diagnosis not present

## 2023-02-28 DIAGNOSIS — J205 Acute bronchitis due to respiratory syncytial virus: Secondary | ICD-10-CM | POA: Diagnosis not present

## 2023-02-28 DIAGNOSIS — B370 Candidal stomatitis: Secondary | ICD-10-CM | POA: Diagnosis not present

## 2023-03-07 DIAGNOSIS — J210 Acute bronchiolitis due to respiratory syncytial virus: Secondary | ICD-10-CM | POA: Diagnosis not present

## 2023-03-07 DIAGNOSIS — J0100 Acute maxillary sinusitis, unspecified: Secondary | ICD-10-CM | POA: Diagnosis not present

## 2023-03-14 DIAGNOSIS — K080 Exfoliation of teeth due to systemic causes: Secondary | ICD-10-CM | POA: Diagnosis not present

## 2023-04-05 DIAGNOSIS — R0609 Other forms of dyspnea: Secondary | ICD-10-CM | POA: Diagnosis not present

## 2023-04-05 DIAGNOSIS — F411 Generalized anxiety disorder: Secondary | ICD-10-CM | POA: Diagnosis not present

## 2023-04-05 DIAGNOSIS — F410 Panic disorder [episodic paroxysmal anxiety] without agoraphobia: Secondary | ICD-10-CM | POA: Diagnosis not present

## 2023-04-05 DIAGNOSIS — R632 Polyphagia: Secondary | ICD-10-CM | POA: Diagnosis not present

## 2023-05-03 DIAGNOSIS — E663 Overweight: Secondary | ICD-10-CM | POA: Diagnosis not present

## 2023-05-03 DIAGNOSIS — Z8639 Personal history of other endocrine, nutritional and metabolic disease: Secondary | ICD-10-CM | POA: Diagnosis not present

## 2023-05-03 DIAGNOSIS — I10 Essential (primary) hypertension: Secondary | ICD-10-CM | POA: Diagnosis not present

## 2023-05-03 DIAGNOSIS — Z6828 Body mass index (BMI) 28.0-28.9, adult: Secondary | ICD-10-CM | POA: Diagnosis not present

## 2023-06-04 DIAGNOSIS — R632 Polyphagia: Secondary | ICD-10-CM | POA: Diagnosis not present

## 2023-06-04 DIAGNOSIS — Z8639 Personal history of other endocrine, nutritional and metabolic disease: Secondary | ICD-10-CM | POA: Diagnosis not present

## 2023-06-04 DIAGNOSIS — F411 Generalized anxiety disorder: Secondary | ICD-10-CM | POA: Diagnosis not present

## 2023-06-04 DIAGNOSIS — I10 Essential (primary) hypertension: Secondary | ICD-10-CM | POA: Diagnosis not present

## 2023-07-05 DIAGNOSIS — Z8639 Personal history of other endocrine, nutritional and metabolic disease: Secondary | ICD-10-CM | POA: Diagnosis not present

## 2023-07-05 DIAGNOSIS — I10 Essential (primary) hypertension: Secondary | ICD-10-CM | POA: Diagnosis not present

## 2023-07-05 DIAGNOSIS — R632 Polyphagia: Secondary | ICD-10-CM | POA: Diagnosis not present

## 2023-07-05 DIAGNOSIS — E039 Hypothyroidism, unspecified: Secondary | ICD-10-CM | POA: Diagnosis not present

## 2023-08-06 DIAGNOSIS — J189 Pneumonia, unspecified organism: Secondary | ICD-10-CM | POA: Diagnosis not present

## 2023-08-06 DIAGNOSIS — R059 Cough, unspecified: Secondary | ICD-10-CM | POA: Diagnosis not present

## 2023-08-09 DIAGNOSIS — R632 Polyphagia: Secondary | ICD-10-CM | POA: Diagnosis not present

## 2023-08-09 DIAGNOSIS — E039 Hypothyroidism, unspecified: Secondary | ICD-10-CM | POA: Diagnosis not present

## 2023-08-09 DIAGNOSIS — J019 Acute sinusitis, unspecified: Secondary | ICD-10-CM | POA: Diagnosis not present

## 2023-08-09 DIAGNOSIS — I10 Essential (primary) hypertension: Secondary | ICD-10-CM | POA: Diagnosis not present

## 2023-09-14 DIAGNOSIS — E7800 Pure hypercholesterolemia, unspecified: Secondary | ICD-10-CM | POA: Diagnosis not present

## 2023-09-14 DIAGNOSIS — I10 Essential (primary) hypertension: Secondary | ICD-10-CM | POA: Diagnosis not present

## 2023-09-14 DIAGNOSIS — E559 Vitamin D deficiency, unspecified: Secondary | ICD-10-CM | POA: Diagnosis not present

## 2023-09-14 DIAGNOSIS — Z131 Encounter for screening for diabetes mellitus: Secondary | ICD-10-CM | POA: Diagnosis not present

## 2023-09-14 DIAGNOSIS — Z0000 Encounter for general adult medical examination without abnormal findings: Secondary | ICD-10-CM | POA: Diagnosis not present

## 2023-09-14 DIAGNOSIS — E039 Hypothyroidism, unspecified: Secondary | ICD-10-CM | POA: Diagnosis not present

## 2023-09-14 DIAGNOSIS — Z1331 Encounter for screening for depression: Secondary | ICD-10-CM | POA: Diagnosis not present

## 2023-09-17 DIAGNOSIS — K080 Exfoliation of teeth due to systemic causes: Secondary | ICD-10-CM | POA: Diagnosis not present

## 2023-10-09 DIAGNOSIS — F4321 Adjustment disorder with depressed mood: Secondary | ICD-10-CM | POA: Diagnosis not present

## 2023-10-09 DIAGNOSIS — I10 Essential (primary) hypertension: Secondary | ICD-10-CM | POA: Diagnosis not present

## 2023-10-09 DIAGNOSIS — E663 Overweight: Secondary | ICD-10-CM | POA: Diagnosis not present

## 2023-10-09 DIAGNOSIS — Z8639 Personal history of other endocrine, nutritional and metabolic disease: Secondary | ICD-10-CM | POA: Diagnosis not present

## 2023-10-23 DIAGNOSIS — R92323 Mammographic fibroglandular density, bilateral breasts: Secondary | ICD-10-CM | POA: Diagnosis not present

## 2023-10-23 DIAGNOSIS — Z1231 Encounter for screening mammogram for malignant neoplasm of breast: Secondary | ICD-10-CM | POA: Diagnosis not present

## 2023-11-13 DIAGNOSIS — E559 Vitamin D deficiency, unspecified: Secondary | ICD-10-CM | POA: Diagnosis not present

## 2023-11-13 DIAGNOSIS — I10 Essential (primary) hypertension: Secondary | ICD-10-CM | POA: Diagnosis not present

## 2023-11-13 DIAGNOSIS — E663 Overweight: Secondary | ICD-10-CM | POA: Diagnosis not present

## 2023-12-04 DIAGNOSIS — M1712 Unilateral primary osteoarthritis, left knee: Secondary | ICD-10-CM | POA: Diagnosis not present

## 2023-12-04 DIAGNOSIS — M7062 Trochanteric bursitis, left hip: Secondary | ICD-10-CM | POA: Diagnosis not present

## 2023-12-04 DIAGNOSIS — M5416 Radiculopathy, lumbar region: Secondary | ICD-10-CM | POA: Diagnosis not present

## 2023-12-04 DIAGNOSIS — M51362 Other intervertebral disc degeneration, lumbar region with discogenic back pain and lower extremity pain: Secondary | ICD-10-CM | POA: Diagnosis not present
